# Patient Record
Sex: Female | Born: 1955 | Race: White | Hispanic: No | Marital: Married | Smoking: Former smoker
Health system: Southern US, Community
[De-identification: ages and names within clinical notes are randomized; demographics above are authoritative.]

## PROBLEM LIST (undated history)

## (undated) DIAGNOSIS — F32A Depression, unspecified: Secondary | ICD-10-CM

## (undated) DIAGNOSIS — E785 Hyperlipidemia, unspecified: Secondary | ICD-10-CM

## (undated) DIAGNOSIS — K219 Gastro-esophageal reflux disease without esophagitis: Secondary | ICD-10-CM

## (undated) DIAGNOSIS — I700 Atherosclerosis of aorta: Secondary | ICD-10-CM

## (undated) DIAGNOSIS — G43909 Migraine, unspecified, not intractable, without status migrainosus: Secondary | ICD-10-CM

## (undated) DIAGNOSIS — J302 Other seasonal allergic rhinitis: Secondary | ICD-10-CM

## (undated) DIAGNOSIS — G4700 Insomnia, unspecified: Secondary | ICD-10-CM

## (undated) DIAGNOSIS — F329 Major depressive disorder, single episode, unspecified: Secondary | ICD-10-CM

## (undated) DIAGNOSIS — S6390XA Sprain of unspecified part of unspecified wrist and hand, initial encounter: Secondary | ICD-10-CM

## (undated) DIAGNOSIS — M1990 Unspecified osteoarthritis, unspecified site: Secondary | ICD-10-CM

## (undated) HISTORY — DX: Gastro-esophageal reflux disease without esophagitis: K21.9

## (undated) HISTORY — PX: TONSILLECTOMY: SUR1361

## (undated) HISTORY — PX: BLADDER SUSPENSION: SHX72

## (undated) HISTORY — DX: Migraine, unspecified, not intractable, without status migrainosus: G43.909

## (undated) HISTORY — DX: Atherosclerosis of aorta: I70.0

## (undated) HISTORY — DX: Insomnia, unspecified: G47.00

## (undated) HISTORY — DX: Depression, unspecified: F32.A

## (undated) HISTORY — DX: Hyperlipidemia, unspecified: E78.5

## (undated) HISTORY — DX: Major depressive disorder, single episode, unspecified: F32.9

## (undated) HISTORY — DX: Sprain of unspecified part of unspecified wrist and hand, initial encounter: S63.90XA

---

## 1976-11-21 HISTORY — PX: OTHER SURGICAL HISTORY: SHX169

## 1981-11-21 HISTORY — PX: DILATION AND CURETTAGE OF UTERUS: SHX78

## 1982-11-21 HISTORY — PX: ABDOMINAL HYSTERECTOMY: SHX81

## 1992-11-21 HISTORY — PX: OTHER SURGICAL HISTORY: SHX169

## 1999-12-01 LAB — HM COLONOSCOPY: HM Colonoscopy: NORMAL

## 2000-05-29 ENCOUNTER — Other Ambulatory Visit: Admission: RE | Admit: 2000-05-29 | Discharge: 2000-05-29 | Payer: Self-pay | Admitting: Gynecology

## 2000-11-21 HISTORY — PX: APPENDECTOMY: SHX54

## 2001-12-17 ENCOUNTER — Ambulatory Visit (HOSPITAL_COMMUNITY): Admission: RE | Admit: 2001-12-17 | Discharge: 2001-12-17 | Payer: Self-pay | Admitting: Internal Medicine

## 2001-12-18 ENCOUNTER — Encounter: Payer: Self-pay | Admitting: Internal Medicine

## 2002-07-19 ENCOUNTER — Other Ambulatory Visit: Admission: RE | Admit: 2002-07-19 | Discharge: 2002-07-19 | Payer: Self-pay | Admitting: Obstetrics and Gynecology

## 2002-10-16 ENCOUNTER — Encounter: Payer: Self-pay | Admitting: Internal Medicine

## 2002-10-16 ENCOUNTER — Ambulatory Visit (HOSPITAL_COMMUNITY): Admission: RE | Admit: 2002-10-16 | Discharge: 2002-10-16 | Payer: Self-pay | Admitting: *Deleted

## 2003-07-07 ENCOUNTER — Other Ambulatory Visit: Admission: RE | Admit: 2003-07-07 | Discharge: 2003-07-07 | Payer: Self-pay | Admitting: Obstetrics and Gynecology

## 2003-11-22 HISTORY — PX: BLADDER SUSPENSION: SHX72

## 2003-12-08 ENCOUNTER — Ambulatory Visit (HOSPITAL_COMMUNITY): Admission: RE | Admit: 2003-12-08 | Discharge: 2003-12-08 | Payer: Self-pay | Admitting: Internal Medicine

## 2004-10-07 ENCOUNTER — Inpatient Hospital Stay (HOSPITAL_COMMUNITY): Admission: RE | Admit: 2004-10-07 | Discharge: 2004-10-09 | Payer: Self-pay | Admitting: Obstetrics and Gynecology

## 2004-11-21 ENCOUNTER — Emergency Department (HOSPITAL_COMMUNITY): Admission: EM | Admit: 2004-11-21 | Discharge: 2004-11-22 | Payer: Self-pay | Admitting: Emergency Medicine

## 2008-11-30 LAB — HM MAMMOGRAPHY: HM Mammogram: NORMAL

## 2009-03-21 LAB — CONVERTED CEMR LAB: Pap Smear: ABNORMAL

## 2009-08-25 ENCOUNTER — Ambulatory Visit: Payer: Self-pay | Admitting: Family Medicine

## 2009-08-25 DIAGNOSIS — J209 Acute bronchitis, unspecified: Secondary | ICD-10-CM

## 2009-08-25 DIAGNOSIS — G4700 Insomnia, unspecified: Secondary | ICD-10-CM

## 2009-08-25 DIAGNOSIS — G43909 Migraine, unspecified, not intractable, without status migrainosus: Secondary | ICD-10-CM

## 2009-08-25 HISTORY — DX: Migraine, unspecified, not intractable, without status migrainosus: G43.909

## 2009-08-25 HISTORY — DX: Insomnia, unspecified: G47.00

## 2009-10-12 ENCOUNTER — Ambulatory Visit: Payer: Self-pay | Admitting: Family Medicine

## 2009-10-22 ENCOUNTER — Ambulatory Visit: Payer: Self-pay | Admitting: Internal Medicine

## 2009-10-22 DIAGNOSIS — J069 Acute upper respiratory infection, unspecified: Secondary | ICD-10-CM | POA: Insufficient documentation

## 2009-11-17 ENCOUNTER — Telehealth: Payer: Self-pay | Admitting: Family Medicine

## 2010-03-31 ENCOUNTER — Ambulatory Visit: Payer: Self-pay | Admitting: Family Medicine

## 2010-03-31 DIAGNOSIS — S6390XA Sprain of unspecified part of unspecified wrist and hand, initial encounter: Secondary | ICD-10-CM

## 2010-03-31 HISTORY — DX: Sprain of unspecified part of unspecified wrist and hand, initial encounter: S63.90XA

## 2010-04-02 ENCOUNTER — Telehealth: Payer: Self-pay | Admitting: Family Medicine

## 2010-12-21 NOTE — Progress Notes (Signed)
Summary: Andrea Gregory please call re: Indomethacin  Phone Note Call from Patient Call back at Work Phone [phone]   Caller: Patient Call For: [name] MD Summary of Call: pt is returning Andrea Gregory call  Per pt, Dr Rayburn Ma prescribed Indomethacin 50mg , one tab two times a day as needed for migraines #20, 0 RF.  Last refill was 07-15-2008, she takes them very infrequently Initial call taken by: Heron Sabins,  Apr 02, 2010 2:38 PM  Follow-up for Phone Call        OK to refill Follow-up by: [name] MD,  Apr 02, 2010 5:06 PM  Additional Follow-up for Phone Call Additional follow up Details #1::        Rx sent to pharmacy from original escribe, pt informed Additional Follow-up by: Sid Falcon LPN,  Apr 02, 2010 5:39 PM    New/Updated Medications: INDOMETHACIN 50 MG CAPS (INDOMETHACIN) take one capsule twice a day as needed for migraines

## 2010-12-21 NOTE — Assessment & Plan Note (Signed)
Summary: finger injury/dm   Vital Signs:  Patient profile:   55 year old female Menstrual status:  hysterectomy Temp:     98.2 degrees F oral BP sitting:   110 / 78  (left arm) Cuff size:   regular  Vitals Entered By: Sid Falcon LPN (Mar 31, 2010 3:27 PM) CC: left little finger injury last night   History of Present Illness: Patient seen with left fifth phalanx injury. This occurred last night. Patient was on the telephone and in the process of taking her shirt off, somehow her finger got caught up in her shirt with twisting type injury. Pain mostly PIP joint with some mild bruising and swelling. Good range of motion. No other injury. Pain with gripping.  Allergies (verified): No Known Drug Allergies  Past History:  Past Medical History: Anemia Hay fever, Allergies  Migraines Blood transfusion tobacco abuse PMH reviewed for relevance  Review of Systems      See HPI  Physical Exam  General:  Well-developed,well-nourished,in no acute distress; alert,appropriate and cooperative throughout examination Extremities:  left fifth finger reveals some mild ecchymosis volar surface PIP joint with some mild swelling around this region. There is tenderness minimally around the PIP joint otherwise no bony tenderness. She has full range of motion with extension and flexion at the PIP and DIP joint left fifth digit. No wrist tenderness.   Impression & Recommendations:  Problem # 1:  SPRAIN AND STRAIN OF UNSPECIFIED SITE OF HAND (ICD-842.10) suspect strain left fifth PIP joint. Splint for 2 weeks. Offered x-ray but patient wishes to treat conservatively at this time. Start range of motion after one to 2 weeks of splinting.  Complete Medication List: 1)  Lexapro 10 Mg Tabs (Escitalopram oxalate) .... Once daily at bedtime 2)  Lorazepam 1 Mg Tabs (Lorazepam) .... Once daily at bedtime 3)  Azithromycin 250 Mg Tabs (Azithromycin) .... 2 by mouth today then one lpo once daily for 4  days. 4)  Hydrocodone-homatropine 5-1.5 Mg/34ml Syrp (Hydrocodone-homatropine) .Marland Kitchen.. 1 teaspoon every 6 hours as needed for cough 5)  Chantix Starting Month Pak 0.5 Mg X 11 & 1 Mg X 42 Tabs (Varenicline tartrate) .... As directed 6)  Chantix Continuing Month Pak 1 Mg Tabs (Varenicline tartrate) .... As directed  Patient Instructions: 1)  Leave on finger splint for one to 2 weeks and then take off and work on range of motion.

## 2011-04-08 NOTE — H&P (Signed)
NAMEKATLEN, SEYER NO.:  [id number]   MEDICAL RECORD NO.:  [id number]          PATIENT TYPE:  INP   LOCATION:  NA                            FACILITY:  [facility]   PHYSICIAN:  Carrington Clamp, M.D. DATE OF BIRTH:  10/23/1956   DATE OF ADMISSION:  DATE OF DISCHARGE:                                HISTORY & PHYSICAL   CHIEF COMPLAINT:  This is a 55 year old G2 P2-0-0-2 complaining of stress  urinary incontinence and rectal splinting.   HISTORY OF PRESENT ILLNESS:  Ms. Christoper Allegra came to see me back in August 2005  complaining of leaking urine when she coughed or sneezed.  She occasionally  wears a pad, has gotten worse with walking, and the amount has been  worsening as well where she is now losing larger and larger amounts.  Occasionally, she will lose urine at least two times a day.  The patient  also complains of a rectocele and having to force stool out and strain in  order to evacuate her bowels.  The patient underwent a cystometrics  evaluation which revealed a leak point pressure of 86 and her using her  abdominal muscles to void.  It also indicated a slight decrease in  compliance and some dribbling accidents.  The patient has stated that her  accidents were mostly with laughing, running, jumping on trampoline,  occasionally soaking her pants.  The results of the urodynamics were  discussed with the patient, and the patient understands that there is a  small chance of need to have a catheter in after the procedure in order to  retrain her bladder and her voiding techniques in order to be able to empty  her bladder adequately.  The patient desires definitive therapy.   PAST MEDICAL HISTORY:  The patient does not have an history of heart  disease, diabetes, high blood pressure, or any other medical abnormalities.   PAST SURGICAL HISTORY:  The patient had a total vaginal hysterectomy without  BSO and an appendectomy.   PAST OBSTETRICAL HISTORY:  Term spontaneous  vaginal delivery x2.   PAST GYNECOLOGICAL HISTORY:  No abnormal Pap smears or history of sexually-  transmitted diseases.   The patient is currently having symptoms of a nasal cold but no cough or  fever.   MEDICATIONS:  1.  Premarin 0.3 mg one p.o. daily.  2.  Nexium 40 mg one p.o. daily.  3.  Wellbutrin XL 150 mg daily.  4.  Occasional indomethacin 50 mg p.o. for migraines.  5.  Lorazepam 1 mg one p.o. q.h.s. for sleep.   ALLERGIES:  None.   TOBACCO:  The patient quit.   PHYSICAL EXAMINATION:  GENERAL:  The patient is well appearing.  HEENT:  Anicteric without lymphadenopathy.  There is no thyromegaly.  HEART:  Regular rate and rhythm.  LUNGS:  Clear to auscultation bilaterally.  ABDOMEN:  Soft, nontender, nondistended.  EXTREMITIES:  Benign.  SKIN:  Benign.  GENITOURINARY:  External genitalia was normal.  Vaginal vault had what  appeared to be positive urethral hypermobility and a cystocele to -2.  There  was also a rectocele to -2, but a well-suspended cuff and no masses.  Adnexa  and anus were otherwise normal.  Cystometrics as stated above, leak point  pressure of 86, voided was 47mL, PVR was 18 mL.  Vaginal packing was used to  help aid in the cystometrics.  There was slightly low compliance and obvious  abdominal wall use to void.   ASSESSMENT:  This is a 55 year old with worsening stress urinary  incontinence with increasing number of activities who desires definitive  therapy for her stress urinary incontinence.  She also has a chronic problem  with constipation and inability to evacuate her rectum.  She will undergo an  anterior and posterior repair and a tension-free vaginal tape procedure by  Gynecare with abdominal needles.  All risks, benefits, and alternatives have  been discussed with the patient.  The patient understands the possibility of  having to wear a catheter afterwards and having to retrain herself how to  void after the surgery.  The patient is  willing and able to be able to do  this.  The patient will receive SEDs for the procedure and receive  preoperative antibiotics.  She will also undergo a course of postoperative  antibiotics and a cystoscopy at the time of surgery in order to ensure  correct placement of the TVT.      MH/MEDQ  D:  10/07/2004  T:  10/07/2004  Job:  [id number]

## 2011-04-08 NOTE — Discharge Summary (Signed)
[name], Andrea Gregory NO.:  [id number]   MEDICAL RECORD NO.:  [id number]          PATIENT TYPE:  INP   LOCATION:  9304                          FACILITY:  [facility]   PHYSICIAN:  Carrington Clamp, M.D. DATE OF BIRTH:  02-Dec-1955   DATE OF ADMISSION:  10/07/2004  DATE OF DISCHARGE:  10/09/2004                                 DISCHARGE SUMMARY   ADMITTING DIAGNOSIS:  Stress urinary incontinence, cystocele, and rectocele.   DISCHARGE DIAGNOSIS:  Stress urinary incontinence, cystocele, and rectocele.   PERTINENT PROCEDURES PERFORMED:  Tension-free vaginal tape and anterior and  posterior repair and cystoscopy.   PERTINENT TESTS PERFORMED:  Preoperative H&H of 13.6 and postoperative H&H  of 10.1 and 29.   HISTORY AND PHYSICAL:  Please refer to dictated History and Physical on  chart but briefly, this is a 55 year old G2 P2-0-0-2 complaining of stress  urinary incontinence, cystocele, and rectal splinting.   HOSPITAL COURSE:  The patient was admitted on October 07, 2004 for the  above-named procedures and underwent them without complications.  On  postoperative day #1, the patient was very nauseated and unable to sit up  and walk around secondary to dry heaves but she was able to keep some sips  down.  The vaginal pack was removed and the patient was moved from the  morphine pump to p.o. medications.  However, by the evening the patient was  still having some difficulties and so the patient was kept until  postoperative day #2.  On postoperative day #2, the narcotic hangover was  resolved and the patient was eating and ambulating and feeling much better.  The patient was discharged with the Foley in place with the following.   MEDICATIONS:  1.  Percocet 5 mg p.o. q.4-6h. p.r.n. pain.  2.  Colace 100 mg b.i.d. x6 weeks.  3.  Iron over-the-counter.  4.  Milk of Magnesia as needed.  5.  Ceftin 250 mg one p.o. b.i.d.   PAIN MANAGEMENT:  As above.   ACTIVITY:  Pelvic  rest x6 weeks, no lifting or straining x6 weeks.   DIET:  High fiber, high water.   WOUND CARE:  Shower only.   The patient was discharged home with the Foley catheter in place and shown  how to change the leg bag for the larger bag a night.  Follow-up date was  the following Tuesday for a voiding trial.      MH/MEDQ  D:  10/28/2004  T:  10/28/2004  Job:  [id number]

## 2011-04-08 NOTE — Op Note (Signed)
NAMEVINAYA, SANCHO NO.:  [id number]   MEDICAL RECORD NO.:  [id number]          PATIENT TYPE:  INP   LOCATION:  9399                          FACILITY:  [facility]   PHYSICIAN:  Carrington Clamp, M.D. DATE OF BIRTH:  [birth year]   DATE OF PROCEDURE:  10/07/2004  DATE OF DISCHARGE:                                 OPERATIVE REPORT   PREOPERATIVE DIAGNOSES:  1.  Stress urinary incontinence.  2.  Cystocele, rectocele.   POSTOPERATIVE DIAGNOSES:  1.  Stress urinary incontinence.  2.  Cystocele, rectocele.   PROCEDURE:  Tension-free vaginal tape Gynecare with abdominal needles,  anterior and posterior repair, and cystoscopy.   SURGEON:  Carrington Clamp, M.D.   ASSISTANT:  Randye Lobo, M.D.   ANESTHESIA:  LMA.   ESTIMATED BLOOD LOSS:  500 cc.   URINE OUTPUT:  700 cc.   IV FLUIDS:  3000 cc.   COMPLICATIONS:  None.   FINDINGS:  Cystocele, rectocele to a -2 above the hymenal ring each.  Cystoscopy revealed a normal dome and anatomy of the bladder and normal-  appearing urethral sphincter.  The needles, after they had been passed were  in place and were not identified inside the bladder and assuredly had not  punctured the bladder.  The ureteral orifices were inspected for several  minutes bilaterally, both before and after the tape had been passed.  Although there was obvious indigo carmine in the bladder and the ureteral  orifices could be identified by sight, no clear huge jet of indigo carmine  was observed from either of the ureteral orifices.  However, each of the  ureteral orifices had a small amount of indigo carmine puff and also  adequate peristalsis.  If there are any problems postoperatively, we will do  an IVP, but the patient's anatomy and physiology of the bladder appear to be  completely normal.   MEDICATIONS:  1% Xylocaine with epinephrine and Premarin cream.   PATHOLOGY:  None.   COUNTS:  Counts were correct x3.   DESCRIPTION OF  PROCEDURE:  After adequate LMA anesthesia was achieved, the  patient was prepped and draped in the usual sterile fashion in the dorsal  lithotomy position.   The anterior leaf close to the top of the cuff was grasped with a pair of  Allis', and an incision was made with the scalpel in between.  The vaginal  mucosa was then elevated off of the vesicouterine fascia with sharp and  blunt dissection with the Metzenbaum scissors in the midline.  The mucosa  was incised in the midline, and the mucosa was removed lateral further with  sharp and blunt dissection with the Metzenbaum scissors.  This was done  until the pelvic diaphragm could be palpated underneath the pubic symphysis.  At this point, stab incisions were made immediately suprapubically with the  scalpel 2 cm bilaterally from lateral to midline.  The abdominal needles  were placed into the abdomen bilaterally.  The needle was pushed through the  rectus fascia and then rocked forward to hug the pubic symphysis  until the  needles were passed through the pelvic diaphragm and through the opening in  the vaginal mucosa.  Cystoscopy was performed at this point, and the needles  were definitely not in the bladder and the bladder was intact.  The ureteral  orifices could be identified, but no clear jet was seen at this point.  The  tape was then passed after the vaginal needles were connected to the  abdominal needles and pushed up through the abdomen.  The tape sheath was  then removed with a Kelly underneath the urethra in order to keep the  appropriate tension.  The bladder was then again looked at, and again, the  ureteral orifices could be identified.  Indigo carmine was clearly in the  bladder, but significant jets could not be seen bilaterally.  There was good  peristalsis of each of the ureteral orifices, and there was obvious indigo  carmine in the bladder.   After the tape had been cut and reduced to below the skin, the skin was   closed with Dermabond and the vaginal mucosa trimmed with Metzenbaum  scissors.  The cystocele was closed with three mattress stitches of 0  Vicryl.  An additional stitch of 2-0 Vicryl was used on the left-hand side  to insure hemostasis.  The vaginal mucosa was then closed with interrupted  figure-of-eight stitches with 2-0 Vicryl.   Attention was then turned to the posterior perineum which was grasped with a  pair of Allis'.  A inverted triangular incision was made with the scalpel,  and this tissue was removed.  The vaginal mucosa was then removed from the  rectovaginal fascia with sharp and blunt dissection with the Metzenbaum  scissors and incised the vaginal mucosa in the midline.  Dissection was  carried out laterally with sharp and blunt dissection with the Metzenbaum  scissors, and then four mattress stitches of 0 Vicryl were placed in the  rectovaginal fascia in order to close the defect.  The rectovaginal mucosa  was then trimmed and closed with a running locked stitch of 2-0 Vicryl.  The  vagina was packed with 2-inch NuGauze with estrogen.  Before the vagina was  packed, it was checked and found to relatively easily admit two fingers side  by side.   The patient tolerated the procedure well.  She was returned to the recovery  room in stable condition.      MH/MEDQ  D:  10/07/2004  T:  10/08/2004  Job:  [id number]

## 2011-04-08 NOTE — Procedures (Signed)
[facility]  Patient:    [name], [name] Visit Number: [id number] MRN: [id number]          Service Type: Attending:  Carylon Perches, M.D. Dictated by:   Carylon Perches, M.D. Proc. Date: 12/17/01                                Stress Test  The patient exercised 8 minutes 5 seconds (2 minutes 5 seconds into stage 3 of the Bruce protocol), attaining a maximum heart rate of 159 (91% of the age predicted maximal rate) at a workload of 10.1 METs and discontinued exercise due to fatigue.  There were no symptoms of chest pain.  There were no arrhythmias.  There were no EKG changes diagnostic of ischemia.  The baseline EKG revealed normal sinus rhythm at 62 beats per minute.  IMPRESSION:  No evidence of exercise-induced ischemia.  Cardiolite images pending. Dictated by:   Carylon Perches, M.D. Attending:  Carylon Perches, M.D. DD:  12/17/01 TD:  12/17/01 Job: [id number]

## 2011-10-31 ENCOUNTER — Encounter: Payer: Self-pay | Admitting: Family Medicine

## 2011-10-31 ENCOUNTER — Ambulatory Visit (INDEPENDENT_AMBULATORY_CARE_PROVIDER_SITE_OTHER): Payer: BC Managed Care – PPO | Admitting: Family Medicine

## 2011-10-31 DIAGNOSIS — G4700 Insomnia, unspecified: Secondary | ICD-10-CM

## 2011-10-31 DIAGNOSIS — E785 Hyperlipidemia, unspecified: Secondary | ICD-10-CM | POA: Insufficient documentation

## 2011-10-31 MED ORDER — LORAZEPAM 1 MG PO TABS: 1.0000 mg | ORAL_TABLET | Freq: Two times a day (BID) | ORAL | Status: DC | PRN

## 2011-10-31 NOTE — Progress Notes (Signed)
  Subjective:    Patient ID: Andrea Gregory, female    DOB: October 09, 1956, 55 y.o.   MRN: [id number]  HPI  Here for the following issues. Recent labs per gynecologist. Elevated cholesterol of 249 with triglycerides 207, HDL 49, and LDL 158. Patient has never taken medications for lipids. Risk factors include age and smoking history. Smokes less than one half packs years per day. Trying to quit. No consistent exercise. No history of hypertension. No diabetes. No family history of premature CAD.  Patient has long history of chronic depression and anxiety and chronic insomnia. Has been on lorazepam 1 mg twice a day for many years and requesting refills. Mostly takes this at night. Also Lexapro 20 mg daily and depression controlled.   Review of Systems  Constitutional: Negative for appetite change and unexpected weight change.  Respiratory: Negative for cough and shortness of breath.   Cardiovascular: Negative for chest pain and palpitations.  Neurological: Negative for dizziness and headaches.  Psychiatric/Behavioral: Negative for confusion and agitation.       Objective:   Physical Exam  Constitutional: She appears well-developed and well-nourished.  Cardiovascular: Normal rate and regular rhythm.   Pulmonary/Chest: Effort normal and breath sounds normal. No respiratory distress. She has no wheezes. She has no rales.  Musculoskeletal: She exhibits no edema.  Psychiatric: She has a normal mood and affect. Her behavior is normal.          Assessment & Plan:  #1 hyperlipidemia. Long discussion with patient regarding options. She is reluctant to consider statin medication at this time. Work on weight loss. Quit smoking. Reduce saturated fat. Increase soluble fiber. Education information given. Reassess fasting lipids in 6 months #2 history of chronic insomnia and anxiety. Refilled lorazepam for 6 months

## 2011-10-31 NOTE — Patient Instructions (Signed)
Cholesterol Control Diet Cholesterol levels in your body are determined significantly by your diet. Cholesterol levels may also be related to heart disease. The following material helps to explain this relationship and discusses what you can do to help keep your heart healthy. Not all cholesterol is bad. Low-density lipoprotein (LDL) cholesterol is the "bad" cholesterol. It may cause fatty deposits to build up inside your arteries. High-density lipoprotein (HDL) cholesterol is "good." It helps to remove the "bad" LDL cholesterol from your blood. Cholesterol is a very important risk factor for heart disease. Other risk factors are high blood pressure, smoking, stress, heredity, and weight. The heart muscle gets its supply of blood through the coronary arteries. If your LDL cholesterol is high and your HDL cholesterol is low, you are at risk for having fatty deposits build up in your coronary arteries. This leaves less room through which blood can flow. Without sufficient blood and oxygen, the heart muscle cannot function properly and you may feel chest pains (angina pectoris). When a coronary artery closes up entirely, a part of the heart muscle may die, causing a heart attack (myocardial infarction). CHECKING CHOLESTEROL When your caregiver sends your blood to a lab to be analyzed for cholesterol, a complete lipid (fat) profile may be done. With this test, the total amount of cholesterol and levels of LDL and HDL are determined. Triglycerides are a type of fat that circulates in the blood and can also be used to determine heart disease risk. The list below describes what the numbers should be: Test: Total Cholesterol.  Less than 200 mg/dl.  Test: LDL "bad cholesterol."  Less than 100 mg/dl.   Less than 70 mg/dl if you are at very high risk of a heart attack or sudden cardiac death.  Test: HDL "good cholesterol."  Greater than 50 mg/dl for women.   Greater than 40 mg/dl for men.  Test:  Triglycerides.  Less than 150 mg/dl.  CONTROLLING CHOLESTEROL WITH DIET Although exercise and lifestyle factors are important, your diet is key. That is because certain foods are known to raise cholesterol and others to lower it. The goal is to balance foods for their effect on cholesterol and more importantly, to replace saturated and trans fat with other types of fat, such as monounsaturated fat, polyunsaturated fat, and omega-3 fatty acids. On average, a person should consume no more than 15 to 17 g of saturated fat daily. Saturated and trans fats are considered "bad" fats, and they will raise LDL cholesterol. Saturated fats are primarily found in animal products such as meats, butter, and cream. However, that does not mean you need to sacrifice all your favorite foods. Today, there are good tasting, low-fat, low-cholesterol substitutes for most of the things you like to eat. Choose low-fat or nonfat alternatives. Choose round or loin cuts of red meat, since these types of cuts are lowest in fat and cholesterol. Chicken (without the skin), fish, veal, and ground turkey breast are excellent choices. Eliminate fatty meats, such as hot dogs and salami. Even shellfish have little or no saturated fat. Have a 3 oz (85 g) portion when you eat lean meat, poultry, or fish. Trans fats are also called "partially hydrogenated oils." They are oils that have been scientifically manipulated so that they are solid at room temperature resulting in a longer shelf life and improved taste and texture of foods in which they are added. Trans fats are found in stick margarine, some tub margarines, cookies, crackers, and baked goods.  When   baking and cooking, oils are an excellent substitute for butter. The monounsaturated oils are especially beneficial since it is believed they lower LDL and raise HDL. The oils you should avoid entirely are saturated tropical oils, such as coconut and palm.  Remember to eat liberally from food  groups that are naturally free of saturated and trans fat, including fish, fruit, vegetables, beans, grains (barley, rice, couscous, bulgur wheat), and pasta (without cream sauces).  IDENTIFYING FOODS THAT LOWER CHOLESTEROL  Soluble fiber may lower your cholesterol. This type of fiber is found in fruits such as apples, vegetables such as broccoli, potatoes, and carrots, legumes such as beans, peas, and lentils, and grains such as barley. Foods fortified with plant sterols (phytosterol) may also lower cholesterol. You should eat at least 2 g per day of these foods for a cholesterol lowering effect.  Read package labels to identify low-saturated fats, trans fats free, and low-fat foods at the supermarket. Select cheeses that have only 2 to 3 g saturated fat per ounce. Use a heart-healthy tub margarine that is free of trans fats or partially hydrogenated oil. When buying baked goods (cookies, crackers), avoid partially hydrogenated oils. Breads and muffins should be made from whole grains (whole-wheat or whole oat flour, instead of "flour" or "enriched flour"). Buy non-creamy canned soups with reduced salt and no added fats.  FOOD PREPARATION TECHNIQUES  Never deep-fry. If you must fry, either stir-fry, which uses very little fat, or use non-stick cooking sprays. When possible, broil, bake, or roast meats, and steam vegetables. Instead of dressing vegetables with butter or margarine, use lemon and herbs, applesauce and cinnamon (for squash and sweet potatoes), nonfat yogurt, salsa, and low-fat dressings for salads.  LOW-SATURATED FAT / LOW-FAT FOOD SUBSTITUTES Meats / Saturated Fat (g)  Avoid: Steak, marbled (3 oz/85 g) / 11 g   Choose: Steak, lean (3 oz/85 g) / 4 g   Avoid: Hamburger (3 oz/85 g) / 7 g   Choose: Hamburger, lean (3 oz/85 g) / 5 g   Avoid: Ham (3 oz/85 g) / 6 g   Choose: Ham, lean cut (3 oz/85 g) / 2.4 g   Avoid: Chicken, with skin, dark meat (3 oz/85 g) / 4 g   Choose: Chicken,  skin removed, dark meat (3 oz/85 g) / 2 g   Avoid: Chicken, with skin, light meat (3 oz/85 g) / 2.5 g   Choose: Chicken, skin removed, light meat (3 oz/85 g) / 1 g  Dairy / Saturated Fat (g)  Avoid: Whole milk (1 cup) / 5 g   Choose: Low-fat milk, 2% (1 cup) / 3 g   Choose: Low-fat milk, 1% (1 cup) / 1.5 g   Choose: Skim milk (1 cup) / 0.3 g   Avoid: Hard cheese (1 oz/28 g) / 6 g   Choose: Skim milk cheese (1 oz/28 g) / 2 to 3 g   Avoid: Cottage cheese, 4% fat (1 cup) / 6.5 g   Choose: Low-fat cottage cheese, 1% fat (1 cup) / 1.5 g   Avoid: Ice cream (1 cup) / 9 g   Choose: Sherbet (1 cup) / 2.5 g   Choose: Nonfat frozen yogurt (1 cup) / 0.3 g   Choose: Frozen fruit bar / trace   Avoid: Whipped cream (1 tbs) / 3.5 g   Choose: Nondairy whipped topping (1 tbs) / 1 g  Condiments / Saturated Fat (g)  Avoid: Mayonnaise (1 tbs) / 2 g   Choose: Low-fat mayonnaise (  1 tbs) / 1 g   Avoid: Butter (1 tbs) / 7 g   Choose: Extra light margarine (1 tbs) / 1 g   Avoid: Coconut oil (1 tbs) / 11.8 g   Choose: Olive oil (1 tbs) / 1.8 g   Choose: Corn oil (1 tbs) / 1.7 g   Choose: Safflower oil (1 tbs) / 1.2 g   Choose: Sunflower oil (1 tbs) / 1.4 g   Choose: Soybean oil (1 tbs) / 2.4 g   Choose: Canola oil (1 tbs) / 1 g  Document Released: 11/07/2005 Document Revised: 07/20/2011 Document Reviewed: 04/28/2011 [employer] Patient Information 2012 [employer]. 

## 2011-11-04 ENCOUNTER — Encounter: Payer: Self-pay | Admitting: Family Medicine

## 2011-11-04 ENCOUNTER — Ambulatory Visit (INDEPENDENT_AMBULATORY_CARE_PROVIDER_SITE_OTHER): Payer: BC Managed Care – PPO | Admitting: Family Medicine

## 2011-11-04 VITALS — BP 122/64 | HR 87 | Temp 98.1°F

## 2011-11-04 DIAGNOSIS — J40 Bronchitis, not specified as acute or chronic: Secondary | ICD-10-CM

## 2011-11-04 MED ORDER — ALBUTEROL SULFATE HFA 108 (90 BASE) MCG/ACT IN AERS: 2.0000 | INHALATION_SPRAY | Freq: Four times a day (QID) | RESPIRATORY_TRACT | Status: DC | PRN

## 2011-11-04 NOTE — Progress Notes (Signed)
  Subjective:    Patient ID: Andrea Gregory, female    DOB: Mar 30, 1956, 55 y.o.   MRN: [id number]  HPI 55 year old white female patient, half a pack per day smoker, and with complaints of fever chills, sneezing, cough, fatigue, that had been going on for about 2 days. She is better today. She has been taking over-the-counter cold and cough medications that have helped. She denies any sinus pressure or pain, no nausea, vomiting, diarrhea, joint pain, or headaches. Reports sick contacts; grandchildren.  Review of Systems As stated above    Objective:   Physical Exam Constitutional: Alert and oriented in no acute distress  ENT: ears are clear bilaterally, pharynx slightly reddened no exudate. No sinus tenderness to palpation. Neck: No lymphadenopathy Lungs: Coarse breath sounds noted but good air movement. No wheezing.  Cardiac: Regular rate and rhythm, no murmurs rubs or gallops Skin: Warm and dry, no cyanosis Psychiatric: Normal mood and affect        Assessment & Plan:  Assessment: Acute bronchitis  Plan: Proair air HFA 2 puffs every 4-6 hours when necessary. Over-the-counter symptomatic treatment for relief. Rest. Drink plenty of fluids. Call if symptoms worsen or persist. Recheck as scheduled and when necessary.

## 2011-11-04 NOTE — Patient Instructions (Signed)

## 2012-04-30 ENCOUNTER — Ambulatory Visit: Payer: BC Managed Care – PPO | Admitting: Family Medicine

## 2012-05-10 ENCOUNTER — Ambulatory Visit: Payer: BC Managed Care – PPO | Admitting: Family Medicine

## 2012-05-22 ENCOUNTER — Other Ambulatory Visit: Payer: Self-pay | Admitting: Family Medicine

## 2012-05-23 NOTE — Telephone Encounter (Signed)
Lorazepam last filled at [facility] 10-31-11, #60 with 5 refills.  Pt is scheduled for [facility] later this month

## 2012-05-24 NOTE — Telephone Encounter (Signed)
May refill for 3 months ?

## 2012-06-19 ENCOUNTER — Encounter: Payer: Self-pay | Admitting: Family Medicine

## 2012-06-19 ENCOUNTER — Ambulatory Visit (INDEPENDENT_AMBULATORY_CARE_PROVIDER_SITE_OTHER): Payer: BC Managed Care – PPO | Admitting: Family Medicine

## 2012-06-19 VITALS — BP 122/84 | Temp 98.3°F | Wt 160.0 lb

## 2012-06-19 DIAGNOSIS — F339 Major depressive disorder, recurrent, unspecified: Secondary | ICD-10-CM

## 2012-06-19 DIAGNOSIS — Z7189 Other specified counseling: Secondary | ICD-10-CM

## 2012-06-19 DIAGNOSIS — Z716 Tobacco abuse counseling: Secondary | ICD-10-CM

## 2012-06-19 DIAGNOSIS — E785 Hyperlipidemia, unspecified: Secondary | ICD-10-CM

## 2012-06-19 LAB — LDL CHOLESTEROL, DIRECT: Direct LDL: 197.9 mg/dL

## 2012-06-19 MED ORDER — ESCITALOPRAM OXALATE 20 MG PO TABS: 20.0000 mg | ORAL_TABLET | Freq: Every day | ORAL | Status: DC

## 2012-06-19 MED ORDER — BUPROPION HCL ER (XL) 300 MG PO TB24: 300.0000 mg | ORAL_TABLET | Freq: Every day | ORAL | Status: DC

## 2012-06-19 NOTE — Progress Notes (Signed)
  Subjective:    Patient ID: Andrea Gregory, female    DOB: 1956-08-30, 56 y.o.   MRN: [id number]  HPI  Patient seen for medical followup. She has history of recurrent depression, dyslipidemia, and chronic anxiety. She is maintained on Lexapro 20 mg daily and takes Ativan as needed. She smokes about one half to one pack cigarettes per day wants to quit. Previously used Chantix but had nightmares and irritability. She has not tried Wellbutrin but like to consider. She feels her depression is stable.  Hyperlipidemia with recent labs. She's made some dietary changes since then. No history of CAD. No family history of premature CAD. She's been reluctant to consider statin therapy. Previous LDL 158. No recent chest pains.  Past Medical History  Diagnosis Date  . INSOMNIA, CHRONIC 08/25/2009  . MIGRAINE HEADACHE 08/25/2009  . URI 10/22/2009  . Acute bronchitis 08/25/2009  . SPRAIN AND STRAIN OF UNSPECIFIED SITE OF HAND 03/31/2010   Past Surgical History  Procedure Date  . Appendectomy 2002  . Abdominal hysterectomy 1984  . Tonsillectomy   . Dilation and curettage of uterus 1983  . Miscarriage 1978    reports that she has been smoking Cigarettes.  She has a 17.5 pack-year smoking history. She does not have any smokeless tobacco history on file. Her alcohol and drug histories not on file. family history includes Alcohol abuse in her other; Cancer in her other; Diabetes in her other; Hypertension in her other; Mental illness in her other; Miscarriages / Stillbirths in her other; and Stroke in her other. No Known Allergies    Review of Systems  Constitutional: Negative for fatigue.  Eyes: Negative for visual disturbance.  Respiratory: Negative for cough, chest tightness, shortness of breath and wheezing.   Cardiovascular: Negative for chest pain, palpitations and leg swelling.  Neurological: Negative for dizziness, seizures, syncope, weakness, light-headedness and headaches.       Objective:   Physical Exam  Constitutional: She appears well-developed and well-nourished. No distress.  Neck: Neck supple. No thyromegaly present.  Cardiovascular: Normal rate and regular rhythm.   Pulmonary/Chest: Effort normal and breath sounds normal. No respiratory distress. She has no wheezes. She has no rales.  Musculoskeletal: She exhibits no edema.  Lymphadenopathy:    She has no cervical adenopathy.          Assessment & Plan:  #1 dyslipidemia. Recheck fasting lipid panel today. Diet and exercise discussed.  #2 smoking cessation. Patient requesting trial of Wellbutrin XL. 300 mg daily. Reviewed possible side effects. She knows to take this for least 2 weeks before stopping smoking.  Plan to take for 3 months. #3 history of recurrent depression/anxiety. Refill Lexapro for one year

## 2012-06-21 ENCOUNTER — Other Ambulatory Visit: Payer: Self-pay | Admitting: *Deleted

## 2012-06-21 DIAGNOSIS — E785 Hyperlipidemia, unspecified: Secondary | ICD-10-CM

## 2012-06-21 MED ORDER — ATORVASTATIN CALCIUM 20 MG PO TABS: 20.0000 mg | ORAL_TABLET | Freq: Every day | ORAL | Status: DC

## 2012-06-21 NOTE — Progress Notes (Signed)
Quick Note:  Pt informed, future orders sent, Rx sent ______

## 2012-08-02 ENCOUNTER — Other Ambulatory Visit (INDEPENDENT_AMBULATORY_CARE_PROVIDER_SITE_OTHER): Payer: BC Managed Care – PPO

## 2012-08-02 DIAGNOSIS — E785 Hyperlipidemia, unspecified: Secondary | ICD-10-CM

## 2012-08-02 LAB — HEPATIC FUNCTION PANEL
ALT: 15 U/L (ref 0–35)
Total Bilirubin: 0.4 mg/dL (ref 0.3–1.2)

## 2012-08-02 LAB — LIPID PANEL
HDL: 49.2 mg/dL (ref >39.00)
VLDL: 25.8 mg/dL (ref 0.0–40.0)

## 2012-08-03 NOTE — Progress Notes (Signed)
Quick Note:  Pt informed ______ 

## 2012-09-18 ENCOUNTER — Other Ambulatory Visit: Payer: Self-pay | Admitting: Family Medicine

## 2012-09-21 ENCOUNTER — Other Ambulatory Visit: Payer: Self-pay | Admitting: Family Medicine

## 2012-11-21 LAB — HM COLONOSCOPY: HM COLON: NORMAL

## 2012-12-15 ENCOUNTER — Other Ambulatory Visit: Payer: Self-pay | Admitting: Family Medicine

## 2012-12-17 ENCOUNTER — Other Ambulatory Visit: Payer: Self-pay | Admitting: Family Medicine

## 2012-12-18 NOTE — Telephone Encounter (Signed)
Lorazepam refill request, last filled 05-22-12, #60 with 5 refills Pt has appt this coming Thursday

## 2012-12-18 NOTE — Telephone Encounter (Signed)
Refill once 

## 2012-12-20 ENCOUNTER — Ambulatory Visit: Payer: BC Managed Care – PPO | Admitting: Family Medicine

## 2013-01-08 ENCOUNTER — Encounter: Payer: Self-pay | Admitting: Family Medicine

## 2013-01-08 ENCOUNTER — Ambulatory Visit (INDEPENDENT_AMBULATORY_CARE_PROVIDER_SITE_OTHER): Payer: BC Managed Care – PPO | Admitting: Family Medicine

## 2013-01-08 VITALS — BP 130/84 | Temp 98.7°F | Wt 164.0 lb

## 2013-01-08 DIAGNOSIS — Z1211 Encounter for screening for malignant neoplasm of colon: Secondary | ICD-10-CM

## 2013-01-08 DIAGNOSIS — G4700 Insomnia, unspecified: Secondary | ICD-10-CM

## 2013-01-08 DIAGNOSIS — E785 Hyperlipidemia, unspecified: Secondary | ICD-10-CM

## 2013-01-08 DIAGNOSIS — F339 Major depressive disorder, recurrent, unspecified: Secondary | ICD-10-CM | POA: Insufficient documentation

## 2013-01-08 NOTE — Progress Notes (Signed)
  Subjective:    Patient ID: Andrea Gregory, female    DOB: 1956-05-29, 57 y.o.   MRN: [id number]  HPI  Patient is a for medical followup. She has hyperlipidemia treated with Lipitor. Lipids were greatly improved at last visit 5 months ago. She has stopped smoking in the past few weeks. Overall feels well. She has some chronic insomnia and is maintained lorazepam for nighttime use only. She has history of recurrent depression currently stable on combination therapy with Lexapro and Wellbutrin. She denies any recent chest pains. No dizziness. Mood stable.  Last colonoscopy on record around 2001. Over 10 years since last colonoscopy. She does not recall any abnormalities with previous. No recent change of stools.  She continues to see gynecologist for regular checkups.  Past Medical History  Diagnosis Date  . INSOMNIA, CHRONIC 08/25/2009  . MIGRAINE HEADACHE 08/25/2009  . URI 10/22/2009  . Acute bronchitis 08/25/2009  . SPRAIN AND STRAIN OF UNSPECIFIED SITE OF HAND 03/31/2010   Past Surgical History  Procedure Laterality Date  . Appendectomy  2002  . Abdominal hysterectomy  1984  . Tonsillectomy    . Dilation and curettage of uterus  1983  . Miscarriage  1978    reports that she has been smoking Cigarettes.  She has a 17.5 pack-year smoking history. She does not have any smokeless tobacco history on file. Her alcohol and drug histories are not on file. family history includes Alcohol abuse in her other; Cancer in her other; Diabetes in her other; Hypertension in her other; Mental illness in her other; Miscarriages / Stillbirths in her other; and Stroke in her other. No Known Allergies    Review of Systems  Constitutional: Negative for appetite change, fatigue and unexpected weight change.  Eyes: Negative for visual disturbance.  Respiratory: Negative for cough, chest tightness, shortness of breath and wheezing.   Cardiovascular: Negative for chest pain, palpitations and leg swelling.   Genitourinary: Negative for dysuria.  Neurological: Negative for dizziness, seizures, syncope, weakness, light-headedness and headaches.       Objective:   Physical Exam  Constitutional: She appears well-developed and well-nourished. No distress.  Neck: Neck supple.  Cardiovascular: Normal rate and regular rhythm.   Pulmonary/Chest: Effort normal and breath sounds normal. No respiratory distress. She has no wheezes. She has no rales.  Musculoskeletal: She exhibits no edema.          Assessment & Plan:  #1 hyperlipidemia. Recheck lipids at followup in 6 months. Continue Lipitor #2 history of recurrent depression. She's had by her estimation over 3 episodes during her lifetime. We've recommended maintaining Wellbutrin and Lexapro  #3 history of chronic insomnia. Sleep hygiene discussed  #4 health maintenance. Last colonoscopy over 10 years ago. Patient agrees to setting up repeat screen-she requests in [place], [place].

## 2013-01-12 ENCOUNTER — Other Ambulatory Visit: Payer: Self-pay | Admitting: Family Medicine

## 2013-01-14 NOTE — Telephone Encounter (Signed)
Please clarify.  I thought she was taking lorazepam only at night.  Refill for 6 months.

## 2013-01-14 NOTE — Telephone Encounter (Signed)
Lorazepam BID last filled 12-15-12, #60 with 0 refills

## 2013-01-15 NOTE — Telephone Encounter (Signed)
I called pt and she said she sometimes takes 2 times a day but also said "what do I take that med for"?  Pt agreed to take at [facility] and see how that goes, #30 ok instead of #60.

## 2013-01-29 ENCOUNTER — Telehealth: Payer: Self-pay

## 2013-01-29 NOTE — Telephone Encounter (Signed)
LMOM to call.

## 2013-01-30 NOTE — Telephone Encounter (Signed)
LMOM to call.

## 2013-02-01 NOTE — Telephone Encounter (Signed)
Letter to pt

## 2013-02-06 NOTE — Telephone Encounter (Signed)
Letter to PCP

## 2013-02-12 ENCOUNTER — Telehealth: Payer: Self-pay

## 2013-02-12 NOTE — Telephone Encounter (Signed)
Pt called to schedule colonoscopy. OV ( due to meds ) with Tana Coast, PA, on 03/05/2013 at 8:00 AM.

## 2013-03-05 ENCOUNTER — Encounter: Payer: Self-pay | Admitting: Gastroenterology

## 2013-03-05 ENCOUNTER — Ambulatory Visit (INDEPENDENT_AMBULATORY_CARE_PROVIDER_SITE_OTHER): Payer: BC Managed Care – PPO | Admitting: Gastroenterology

## 2013-03-05 VITALS — BP 120/78 | HR 71 | Temp 97.8°F | Ht 65.0 in | Wt 163.4 lb

## 2013-03-05 DIAGNOSIS — R1314 Dysphagia, pharyngoesophageal phase: Secondary | ICD-10-CM

## 2013-03-05 DIAGNOSIS — R1310 Dysphagia, unspecified: Secondary | ICD-10-CM

## 2013-03-05 DIAGNOSIS — K219 Gastro-esophageal reflux disease without esophagitis: Secondary | ICD-10-CM | POA: Insufficient documentation

## 2013-03-05 DIAGNOSIS — Z800 Family history of malignant neoplasm of digestive organs: Secondary | ICD-10-CM

## 2013-03-05 MED ORDER — PEG 3350-KCL-NA BICARB-NACL 420 G PO SOLR: 4000.0000 mL | ORAL | Status: DC

## 2013-03-05 NOTE — Patient Instructions (Addendum)
We have scheduled you for an upper endoscopy and colonoscopy with Dr. Jena Gauss. Please see separate instructions.  Gastroesophageal Reflux Disease, Adult Gastroesophageal reflux disease (GERD) happens when acid from your stomach flows up into the esophagus. When acid comes in contact with the esophagus, the acid causes soreness (inflammation) in the esophagus. Over time, GERD may create small holes (ulcers) in the lining of the esophagus. CAUSES   Increased body weight. This puts pressure on the stomach, making acid rise from the stomach into the esophagus.  Smoking. This increases acid production in the stomach.  Drinking alcohol. This causes decreased pressure in the lower esophageal sphincter (valve or ring of muscle between the esophagus and stomach), allowing acid from the stomach into the esophagus.  Late evening meals and a full stomach. This increases pressure and acid production in the stomach.  A malformed lower esophageal sphincter. Sometimes, no cause is found. SYMPTOMS   Burning pain in the lower part of the mid-chest behind the breastbone and in the mid-stomach area. This may occur twice a week or more often.  Trouble swallowing.  Sore throat.  Dry cough.  Asthma-like symptoms including chest tightness, shortness of breath, or wheezing. DIAGNOSIS  Your caregiver may be able to diagnose GERD based on your symptoms. In some cases, X-rays and other tests may be done to check for complications or to check the condition of your stomach and esophagus. TREATMENT  Your caregiver may recommend over-the-counter or prescription medicines to help decrease acid production. Ask your caregiver before starting or adding any new medicines.  HOME CARE INSTRUCTIONS   Change the factors that you can control. Ask your caregiver for guidance concerning weight loss, quitting smoking, and alcohol consumption.  Avoid foods and drinks that make your symptoms worse, such as:  Caffeine or  alcoholic drinks.  Chocolate.  Peppermint or mint flavorings.  Garlic and onions.  Spicy foods.  Citrus fruits, such as oranges, lemons, or limes.  Tomato-based foods such as sauce, chili, salsa, and pizza.  Fried and fatty foods.  Avoid lying down for the 3 hours prior to your bedtime or prior to taking a nap.  Eat small, frequent meals instead of large meals.  Wear loose-fitting clothing. Do not wear anything tight around your waist that causes pressure on your stomach.  Raise the head of your bed 6 to 8 inches with wood blocks to help you sleep. Extra pillows will not help.  Only take over-the-counter or prescription medicines for pain, discomfort, or fever as directed by your caregiver.  Do not take aspirin, ibuprofen, or other nonsteroidal anti-inflammatory drugs (NSAIDs). SEEK IMMEDIATE MEDICAL CARE IF:   You have pain in your arms, neck, jaw, teeth, or back.  Your pain increases or changes in intensity or duration.  You develop nausea, vomiting, or sweating (diaphoresis).  You develop shortness of breath, or you faint.  Your vomit is green, yellow, black, or looks like coffee grounds or blood.  Your stool is red, bloody, or black. These symptoms could be signs of other problems, such as heart disease, gastric bleeding, or esophageal bleeding. MAKE SURE YOU:   Understand these instructions.  Will watch your condition.  Will get help right away if you are not doing well or get worse. Document Released: 08/17/2005 Document Revised: 01/30/2012 Document Reviewed: 05/27/2011 [facility] Patient Information 2013 [place], [place].

## 2013-03-05 NOTE — Progress Notes (Signed)
Cc PCP 

## 2013-03-05 NOTE — Assessment & Plan Note (Signed)
57 year old lady with prior remote history of heartburn treated with Nexium she now presents with acute onset heartburn, nausea. She has some day to solid food esophageal dysphagia. She also "coughs" up meat several minutes after a meal. She may have developed esophageal stricture and/or Zenker's diverticulum. Given new onset symptoms, recommend upper endoscopy with possible dilation in the near future with Dr. Jena Gauss. We will give Phenergan 25 mg IV 30 minutes prior to procedure to augment conscious sedation given her polypharmacy.  I have discussed the risks, alternatives, benefits with regards to but not limited to the risk of reaction to medication, bleeding, infection, perforation and the patient is agreeable to proceed. Written consent to be obtained.  She wants to hold off on taking daily PPI until after procedures.

## 2013-03-05 NOTE — Assessment & Plan Note (Signed)
Family history of colon cancer at advanced age in second degree relative. Several family members with breast and ovarian cancer. Recommend average risk screening colonoscopy at this time. Phenergan 25mg  IV 30 mins before procedure to augment conscious sedation given polypharmacy.  I have discussed the risks, alternatives, benefits with regards to but not limited to the risk of reaction to medication, bleeding, infection, perforation and the patient is agreeable to proceed. Written consent to be obtained.

## 2013-03-05 NOTE — Progress Notes (Signed)
Primary Care Physician:  Kristian Covey, MD  Primary Gastroenterologist:  Roetta Sessions, MD   Chief Complaint  Patient presents with  . Colonoscopy    HPI:  Andrea Gregory is a 57 y.o. female here to schedule a colonoscopy. Her last colonoscopy was greater than 10 years ago, she denies any history of polyps. She had a paternal grandmother who had colon cancer at an advanced age. Two paternal aunts who had breast cancer, her sister had ovarian cancer. Major heartburn and regurgitation, nausea for the past one month. Mostly late afternoon, sometimes right after eat, doesn't matter what eats. Water does it. Very little esophageal dysphagia to solid foods. She also has had to cough up meat 30 minutes after meal. No abdominal pain. Takes TUMS regularly. Used to take Nexium long time ago. Ibuprofen no more than once per week. No ASA. No constipation, diarrhea, melena, rectal bleeding, weight loss.  Current Outpatient Prescriptions  Medication Sig Dispense Refill  . albuterol (PROVENTIL HFA;VENTOLIN HFA) 108 (90 BASE) MCG/ACT inhaler Inhale 2 puffs into the lungs every 6 (six) hours as needed for wheezing.  1 Inhaler  1  . atorvastatin (LIPITOR) 20 MG tablet take 1 tablet by mouth once daily  90 tablet  3  . buPROPion (WELLBUTRIN XL) 300 MG 24 hr tablet take 1 tablet by mouth once daily  90 tablet  3  . escitalopram (LEXAPRO) 20 MG tablet Take 1 tablet (20 mg total) by mouth daily.  90 tablet  3  . LORazepam (ATIVAN) 1 MG tablet Take 1 tablet (1 mg total) by mouth at bedtime as needed for anxiety.  30 tablet  5   No current facility-administered medications for this visit.    Allergies as of 03/05/2013  . (No Known Allergies)    Past Medical History  Diagnosis Date  . INSOMNIA, CHRONIC 08/25/2009  . MIGRAINE HEADACHE 08/25/2009  . URI 10/22/2009  . Acute bronchitis 08/25/2009  . SPRAIN AND STRAIN OF UNSPECIFIED SITE OF HAND 03/31/2010  . Hyperlipidemia   . Depression     Past Surgical  History  Procedure Laterality Date  . Appendectomy  2002  . Abdominal hysterectomy  1984  . Tonsillectomy    . Dilation and curettage of uterus  1983  . Miscarriage  1978  . Bladder suspension  2005    Family History  Problem Relation Age of Onset  . Diabetes Other   . Hypertension Other   . Cancer Sister     ovarian  . Stroke Other   . Miscarriages / Stillbirths Other   . Alcohol abuse Other   . Mental illness Other   . Colon cancer Paternal Grandmother     age >75  . Breast cancer Paternal Aunt     two  . Lung cancer Neg Hx     History   Social History  . Marital Status: Divorced    Spouse Name: N/A    Number of Children: N/A  . Years of Education: N/A   Occupational History  . Not on file.   Social History Main Topics  . Smoking status: Current Every Day Smoker -- 0.50 packs/day for 35 years    Types: Cigarettes  . Smokeless tobacco: Not on file  . Alcohol Use: Not on file  . Drug Use: Not on file  . Sexually Active: Not on file   Other Topics Concern  . Not on file   Social History Narrative  . No narrative on file  ROS:  General: Negative for anorexia, weight loss, fever, chills, fatigue, weakness. Eyes: Negative for vision changes.  ENT: Negative for hoarseness, difficulty swallowing , nasal congestion. CV: Negative for chest pain, angina, palpitations, dyspnea on exertion, peripheral edema.  Respiratory: Negative for dyspnea at rest, dyspnea on exertion, cough, sputum, wheezing.  GI: See history of present illness. GU:  Negative for dysuria, hematuria, urinary incontinence, urinary frequency, nocturnal urination.  MS: Negative for joint pain, low back pain.  Derm: Negative for rash or itching.  Neuro: Negative for weakness, abnormal sensation, seizure, frequent headaches, memory loss, confusion.  Psych: Negative for anxiety, depression, suicidal ideation, hallucinations.  Endo: Negative for unusual weight change.  Heme: Negative for  bruising or bleeding. Allergy: Negative for rash or hives.    Physical Examination:  BP 120/78  Pulse 71  Temp(Src) 97.8 F (36.6 C) (Oral)  Ht 5\' 5"  (1.651 m)  Wt 163 lb 6.4 oz (74.118 kg)  BMI 27.19 kg/m2   General: Well-nourished, well-developed in no acute distress.  Head: Normocephalic, atraumatic.   Eyes: Conjunctiva pink, no icterus. Mouth: Oropharyngeal mucosa moist and pink , no lesions erythema or exudate. Neck: Supple without thyromegaly, masses, or lymphadenopathy.  Lungs: Clear to auscultation bilaterally.  Heart: Regular rate and rhythm, no murmurs rubs or gallops.  Abdomen: Bowel sounds are normal, nontender, nondistended, no hepatosplenomegaly or masses, no abdominal bruits or    hernia , no rebound or guarding.   Rectal: defer Extremities: No lower extremity edema. No clubbing or deformities.  Neuro: Alert and oriented x 4 , grossly normal neurologically.  Skin: Warm and dry, no rash or jaundice.   Psych: Alert and cooperative, normal mood and affect.

## 2013-03-13 ENCOUNTER — Other Ambulatory Visit: Payer: Self-pay | Admitting: Internal Medicine

## 2013-03-13 MED ORDER — SOD PICOSULFATE-MAG OX-CIT ACD 10-3.5-12 MG-GM-GM PO PACK: 1.0000 | PACK | ORAL | Status: DC

## 2013-03-14 ENCOUNTER — Encounter (HOSPITAL_COMMUNITY): Payer: Self-pay | Admitting: Pharmacy Technician

## 2013-03-28 ENCOUNTER — Encounter (HOSPITAL_COMMUNITY)
Admission: RE | Disposition: A | Discharge status: Home / Self Care | Payer: Self-pay | Source: Ambulatory Visit | Attending: Internal Medicine

## 2013-03-28 ENCOUNTER — Ambulatory Visit (HOSPITAL_COMMUNITY)
Admission: RE | Admit: 2013-03-28 | Discharge: 2013-03-28 | Disposition: A | Discharge status: Home / Self Care | Payer: BC Managed Care – PPO | Source: Ambulatory Visit | Attending: Internal Medicine | Admitting: Internal Medicine

## 2013-03-28 ENCOUNTER — Encounter (HOSPITAL_COMMUNITY): Payer: Self-pay | Admitting: *Deleted

## 2013-03-28 DIAGNOSIS — R1310 Dysphagia, unspecified: Secondary | ICD-10-CM

## 2013-03-28 DIAGNOSIS — Z1211 Encounter for screening for malignant neoplasm of colon: Secondary | ICD-10-CM | POA: Insufficient documentation

## 2013-03-28 DIAGNOSIS — K210 Gastro-esophageal reflux disease with esophagitis: Secondary | ICD-10-CM

## 2013-03-28 DIAGNOSIS — K219 Gastro-esophageal reflux disease without esophagitis: Secondary | ICD-10-CM

## 2013-03-28 DIAGNOSIS — K5730 Diverticulosis of large intestine without perforation or abscess without bleeding: Secondary | ICD-10-CM

## 2013-03-28 DIAGNOSIS — E785 Hyperlipidemia, unspecified: Secondary | ICD-10-CM | POA: Insufficient documentation

## 2013-03-28 DIAGNOSIS — K449 Diaphragmatic hernia without obstruction or gangrene: Secondary | ICD-10-CM

## 2013-03-28 DIAGNOSIS — Z800 Family history of malignant neoplasm of digestive organs: Secondary | ICD-10-CM

## 2013-03-28 DIAGNOSIS — K222 Esophageal obstruction: Secondary | ICD-10-CM

## 2013-03-28 HISTORY — PX: COLONOSCOPY WITH ESOPHAGOGASTRODUODENOSCOPY (EGD): SHX5779

## 2013-03-28 HISTORY — PX: MALONEY DILATION: SHX5535

## 2013-03-28 HISTORY — DX: Other seasonal allergic rhinitis: J30.2

## 2013-03-28 HISTORY — PX: SAVORY DILATION: SHX5439

## 2013-03-28 SURGERY — COLONOSCOPY WITH ESOPHAGOGASTRODUODENOSCOPY (EGD)
Anesthesia: Moderate Sedation

## 2013-03-28 MED ORDER — MEPERIDINE HCL 100 MG/ML IJ SOLN
INTRAMUSCULAR | Status: AC
  Filled 2013-03-28: qty 1

## 2013-03-28 MED ORDER — STERILE WATER FOR IRRIGATION IR SOLN
Status: DC | PRN
  Administered 2013-03-28: 08:00:00

## 2013-03-28 MED ORDER — ONDANSETRON HCL 4 MG/2ML IJ SOLN
INTRAMUSCULAR | Status: AC
  Filled 2013-03-28: qty 2

## 2013-03-28 MED ORDER — PROMETHAZINE HCL 25 MG/ML IJ SOLN
INTRAMUSCULAR | Status: AC
  Filled 2013-03-28: qty 1

## 2013-03-28 MED ORDER — PROMETHAZINE HCL 25 MG/ML IJ SOLN
25.0000 mg | Freq: Once | INTRAMUSCULAR | Status: AC
  Administered 2013-03-28: 25 mg via INTRAVENOUS

## 2013-03-28 MED ORDER — MIDAZOLAM HCL 5 MG/5ML IJ SOLN
INTRAMUSCULAR | Status: AC
  Filled 2013-03-28: qty 10

## 2013-03-28 MED ORDER — BUTAMBEN-TETRACAINE-BENZOCAINE 2-2-14 % EX AERO
INHALATION_SPRAY | CUTANEOUS | Status: DC | PRN
  Administered 2013-03-28: 2 via TOPICAL

## 2013-03-28 MED ORDER — ONDANSETRON HCL 4 MG/2ML IJ SOLN
INTRAMUSCULAR | Status: DC | PRN
  Administered 2013-03-28: 4 mg via INTRAVENOUS

## 2013-03-28 MED ORDER — SODIUM CHLORIDE 0.9 % IJ SOLN
INTRAMUSCULAR | Status: AC
  Filled 2013-03-28: qty 10

## 2013-03-28 MED ORDER — SODIUM CHLORIDE 0.9 % IV SOLN
INTRAVENOUS | Status: DC
  Administered 2013-03-28: 1000 mL via INTRAVENOUS

## 2013-03-28 MED ORDER — MEPERIDINE HCL 100 MG/ML IJ SOLN
INTRAMUSCULAR | Status: DC | PRN
  Administered 2013-03-28 (×2): 50 mg via INTRAVENOUS

## 2013-03-28 MED ORDER — MIDAZOLAM HCL 5 MG/5ML IJ SOLN
INTRAMUSCULAR | Status: DC | PRN
  Administered 2013-03-28 (×2): 2 mg via INTRAVENOUS

## 2013-03-28 NOTE — Op Note (Signed)
[facility] [address] [place], [address]   ENDOSCOPY PROCEDURE REPORT  PATIENT: Andrea Gregory, Andrea Gregory  MR#: [id number] BIRTHDATE: 08/30/1956 , 56  yrs. old GENDER: Female ENDOSCOPIST: R.  Roetta Sessions, MD FACP FACG REFERRED BY:  [name], M.D. PROCEDURE DATE:  03/28/2013 PROCEDURE:     EGD with Elease Hashimoto dilation  INDICATIONS:     Worsening GERD symptoms; esophageal dysphagia  INFORMED CONSENT:   The risks, benefits, limitations, alternatives and imponderables have been discussed.  The potential for biopsy, esophogeal dilation, etc. have also been reviewed.  Questions have been answered.  All parties agreeable.  Please see the history and physical in the medical record for more information.  MEDICATIONS:  Versed 4 mg IV and Demerol 100 mg IV in divided doses. Phenergan 25 mg IV and Zofran 4 mg IV. Cetacaine spray.  DESCRIPTION OF PROCEDURE:   The EG-2990i ([id number])  endoscope was introduced through the mouth and advanced to the second portion of the duodenum without difficulty or limitations.  The mucosal surfaces were surveyed very carefully during advancement of the scope and upon withdrawal.  Retroflexion view of the proximal stomach and esophagogastric junction was performed.      FINDINGS:  Circumferential distal esophageal erosions with one 3 mm area of ulceration. Noncritical. Schatzki's ring present. No Barrett's esophagus. Stomach empty. Patient has a small hiatal hernia. Gastric mucosa otherwise appeared normal. Patent pylorus. Normal first and second portion of the duodenum  THERAPEUTIC / DIAGNOSTIC MANEUVERS PERFORMED:  A 54 French Maloney dilators passed to full insertion easily. A look back revealed ring remained intact. Subsequently, a 13 French Maloney dilators passed easily to full insertion.  A look back revealed the ring had been dilated.  There was a very small tear through the UES.   COMPLICATIONS:  None  IMPRESSION:   Ulcerative reflux esophagitis. Schatzki's ring-status post dilation as described above. Small hiatal hernia.  RECOMMENDATIONS:  Begin Protonix 40 mg daily. Antireflux diet/literature provided. See colonoscopy report.    _______________________________ R. Roetta Sessions, MD FACP [facility] eSigned:  R. Roetta Sessions, MD FACP [facility] 03/28/2013 8:59 AM     CC:  PATIENT NAME:  Andrea Gregory, Andrea Gregory MR#: [id number]

## 2013-03-28 NOTE — Interval H&P Note (Signed)
History and Physical Interval Note:  03/28/2013 8:33 AM  Andrea Gregory  has presented today for surgery, with the diagnosis of GERD, Family Hx of Colon Rectal Cancer, Esophageal Dysphagia  The various methods of treatment have been discussed with the patient and family. After consideration of risks, benefits and other options for treatment, the patient has consented to  Procedure(s) with comments: COLONOSCOPY WITH ESOPHAGOGASTRODUODENOSCOPY (EGD) (N/A) - 8:30-moved to [name] to notify pt MALONEY DILATION (N/A) SAVORY DILATION (N/A) as a surgical intervention .  The patient's history has been reviewed, patient examined, no change in status, stable for surgery.  I have reviewed the patient's chart and labs.  Questions were answered to the patient's satisfaction.     [name]  Patient seen and examined. EGD with possible esophageal dilation and colonoscopy per plan.  The risks, benefits, limitations, imponderables and alternatives regarding both EGD and colonoscopy have been reviewed with the patient. Questions have been answered. All parties agreeable.

## 2013-03-28 NOTE — Op Note (Signed)
[facility] [address] [place], [address]   COLONOSCOPY PROCEDURE REPORT  PATIENT: Andrea, Gregory  MR#:         [id number] BIRTHDATE: 1956-07-30 , 56  yrs. old GENDER: Female ENDOSCOPIST: R.  Roetta Sessions, MD FACP FACG REFERRED BY:  [name], M.D. PROCEDURE DATE:  03/28/2013 PROCEDURE:     Screening colonoscopy  INDICATIONS: Average risk colorectal cancer screening  INFORMED CONSENT:  The risks, benefits, alternatives and imponderables including but not limited to bleeding, perforation as well as the possibility of a missed lesion have been reviewed.  The potential for biopsy, lesion removal, etc. have also been discussed.  Questions have been answered.  All parties agreeable. Please see the history and physical in the medical record for more information.  MEDICATIONS: Versed 4 mg IV and Demerol 100 mg IV in divided doses. Phenergan 25 mg IV and Zofran 4 mg IV  DESCRIPTION OF PROCEDURE:  After a digital rectal exam was performed, the EG-2990i ([id number]) and EC-[id number] ([id number]) colonoscope was advanced from the anus through the rectum and colon to the area of the cecum, ileocecal valve and appendiceal orifice. The cecum was deeply intubated.  These structures were well-seen and photographed for the record.  From the level of the cecum and ileocecal valve, the scope was slowly and cautiously withdrawn. The mucosal surfaces were carefully surveyed utilizing scope tip deflection to facilitate fold flattening as needed.  The scope was pulled down into the rectum where a thorough examination including retroflexion was performed.    FINDINGS:  Adequate preparation. Normal rectum. Scattered left-sided diverticula; otherwise, the remainder of the colonic mucosa appeared normal.  THERAPEUTIC / DIAGNOSTIC MANEUVERS PERFORMED:  None  COMPLICATIONS: None  CECAL WITHDRAWAL TIME:  9 minutes  IMPRESSION:  Colonic diverticulosis  RECOMMENDATIONS: Repeat  screening colonoscopy in 10 years   _______________________________ eSigned:  R. Roetta Sessions, MD FACP [facility] 03/28/2013 9:20 AM   CC:

## 2013-03-28 NOTE — H&P (View-Only) (Signed)
Primary Care Physician:  BURCHETTE,BRUCE W, MD  Primary Gastroenterologist:  Michael Rourk, MD   Chief Complaint  Patient presents with  . Colonoscopy    HPI:  Andrea Gregory is a 56 y.o. female here to schedule a colonoscopy. Her last colonoscopy was greater than 10 years ago, she denies any history of polyps. She had a paternal grandmother who had colon cancer at an advanced age. Two paternal aunts who had breast cancer, her sister had ovarian cancer. Major heartburn and regurgitation, nausea for the past one month. Mostly late afternoon, sometimes right after eat, doesn't matter what eats. Water does it. Very little esophageal dysphagia to solid foods. She also has had to cough up meat 30 minutes after meal. No abdominal pain. Takes TUMS regularly. Used to take Nexium long time ago. Ibuprofen no more than once per week. No ASA. No constipation, diarrhea, melena, rectal bleeding, weight loss.  Current Outpatient Prescriptions  Medication Sig Dispense Refill  . albuterol (PROVENTIL HFA;VENTOLIN HFA) 108 (90 BASE) MCG/ACT inhaler Inhale 2 puffs into the lungs every 6 (six) hours as needed for wheezing.  1 Inhaler  1  . atorvastatin (LIPITOR) 20 MG tablet take 1 tablet by mouth once daily  90 tablet  3  . buPROPion (WELLBUTRIN XL) 300 MG 24 hr tablet take 1 tablet by mouth once daily  90 tablet  3  . escitalopram (LEXAPRO) 20 MG tablet Take 1 tablet (20 mg total) by mouth daily.  90 tablet  3  . LORazepam (ATIVAN) 1 MG tablet Take 1 tablet (1 mg total) by mouth at bedtime as needed for anxiety.  30 tablet  5   No current facility-administered medications for this visit.    Allergies as of 03/05/2013  . (No Known Allergies)    Past Medical History  Diagnosis Date  . INSOMNIA, CHRONIC 08/25/2009  . MIGRAINE HEADACHE 08/25/2009  . URI 10/22/2009  . Acute bronchitis 08/25/2009  . SPRAIN AND STRAIN OF UNSPECIFIED SITE OF HAND 03/31/2010  . Hyperlipidemia   . Depression     Past Surgical  History  Procedure Laterality Date  . Appendectomy  2002  . Abdominal hysterectomy  1984  . Tonsillectomy    . Dilation and curettage of uterus  1983  . Miscarriage  1978  . Bladder suspension  2005    Family History  Problem Relation Age of Onset  . Diabetes Other   . Hypertension Other   . Cancer Sister     ovarian  . Stroke Other   . Miscarriages / Stillbirths Other   . Alcohol abuse Other   . Mental illness Other   . Colon cancer Paternal Grandmother     age >60  . Breast cancer Paternal Aunt     two  . Lung cancer Neg Hx     History   Social History  . Marital Status: Divorced    Spouse Name: N/A    Number of Children: N/A  . Years of Education: N/A   Occupational History  . Not on file.   Social History Main Topics  . Smoking status: Current Every Day Smoker -- 0.50 packs/day for 35 years    Types: Cigarettes  . Smokeless tobacco: Not on file  . Alcohol Use: Not on file  . Drug Use: Not on file  . Sexually Active: Not on file   Other Topics Concern  . Not on file   Social History Narrative  . No narrative on file        ROS:  General: Negative for anorexia, weight loss, fever, chills, fatigue, weakness. Eyes: Negative for vision changes.  ENT: Negative for hoarseness, difficulty swallowing , nasal congestion. CV: Negative for chest pain, angina, palpitations, dyspnea on exertion, peripheral edema.  Respiratory: Negative for dyspnea at rest, dyspnea on exertion, cough, sputum, wheezing.  GI: See history of present illness. GU:  Negative for dysuria, hematuria, urinary incontinence, urinary frequency, nocturnal urination.  MS: Negative for joint pain, low back pain.  Derm: Negative for rash or itching.  Neuro: Negative for weakness, abnormal sensation, seizure, frequent headaches, memory loss, confusion.  Psych: Negative for anxiety, depression, suicidal ideation, hallucinations.  Endo: Negative for unusual weight change.  Heme: Negative for  bruising or bleeding. Allergy: Negative for rash or hives.    Physical Examination:  BP 120/78  Pulse 71  Temp(Src) 97.8 F (36.6 C) (Oral)  Ht 5' 5" (1.651 m)  Wt 163 lb 6.4 oz (74.118 kg)  BMI 27.19 kg/m2   General: Well-nourished, well-developed in no acute distress.  Head: Normocephalic, atraumatic.   Eyes: Conjunctiva pink, no icterus. Mouth: Oropharyngeal mucosa moist and pink , no lesions erythema or exudate. Neck: Supple without thyromegaly, masses, or lymphadenopathy.  Lungs: Clear to auscultation bilaterally.  Heart: Regular rate and rhythm, no murmurs rubs or gallops.  Abdomen: Bowel sounds are normal, nontender, nondistended, no hepatosplenomegaly or masses, no abdominal bruits or    hernia , no rebound or guarding.   Rectal: defer Extremities: No lower extremity edema. No clubbing or deformities.  Neuro: Alert and oriented x 4 , grossly normal neurologically.  Skin: Warm and dry, no rash or jaundice.   Psych: Alert and cooperative, normal mood and affect.    

## 2013-04-01 ENCOUNTER — Encounter (HOSPITAL_COMMUNITY): Payer: Self-pay | Admitting: Internal Medicine

## 2013-06-05 NOTE — Progress Notes (Signed)
REVIEWED.  

## 2013-07-08 ENCOUNTER — Encounter: Payer: Self-pay | Admitting: Family Medicine

## 2013-07-08 ENCOUNTER — Ambulatory Visit (INDEPENDENT_AMBULATORY_CARE_PROVIDER_SITE_OTHER): Payer: BC Managed Care – PPO | Admitting: Family Medicine

## 2013-07-08 VITALS — BP 124/80 | HR 74 | Temp 98.1°F | Wt 164.0 lb

## 2013-07-08 DIAGNOSIS — F339 Major depressive disorder, recurrent, unspecified: Secondary | ICD-10-CM

## 2013-07-08 DIAGNOSIS — Z23 Encounter for immunization: Secondary | ICD-10-CM

## 2013-07-08 DIAGNOSIS — E785 Hyperlipidemia, unspecified: Secondary | ICD-10-CM

## 2013-07-08 LAB — HEPATIC FUNCTION PANEL
ALT: 14 U/L (ref 0–35)
Total Protein: 6.9 g/dL (ref 6.0–8.3)

## 2013-07-08 LAB — LIPID PANEL
Cholesterol: 257 mg/dL — ABNORMAL HIGH (ref 0–200)
HDL: 51.4 mg/dL (ref >39.00)
Triglycerides: 151 mg/dL — ABNORMAL HIGH (ref 0.0–149.0)

## 2013-07-08 LAB — LDL CHOLESTEROL, DIRECT: Direct LDL: 187.6 mg/dL

## 2013-07-08 NOTE — Progress Notes (Signed)
Subjective:    Patient ID: Andrea Gregory, female    DOB: Jul 21, 1956, 57 y.o.   MRN: [id number]  HPI Medical followup  Patient has history of hyperlipidemia and depression. She's had occasional muscle aches with Lipitor and has reduced taking this to only once or twice a week. She does not have any history of peripheral vascular disease or coronary disease.  She did quit smoking altogether last visit but has resumed about one to 2 cigarettes per day. Recently engaged in dealing with stress issues as she is getting things arranged. Her depression is stable. She remains on Wellbutrin and Lexapro. She continues to take lorazepam at night for insomnia.  No consistent alcohol use.  Past Medical History  Diagnosis Date  . INSOMNIA, CHRONIC 08/25/2009  . MIGRAINE HEADACHE 08/25/2009  . URI 10/22/2009  . Acute bronchitis 08/25/2009  . SPRAIN AND STRAIN OF UNSPECIFIED SITE OF HAND 03/31/2010    pt states that she doesnt have this hx  . Hyperlipidemia   . Depression   . Seasonal allergies    Past Surgical History  Procedure Laterality Date  . Appendectomy  2002  . Abdominal hysterectomy  1984  . Tonsillectomy    . Dilation and curettage of uterus  1983  . Miscarriage  1978  . Bladder suspension  2005  . Exploratory laprotomy  1994  . Colonoscopy with esophagogastroduodenoscopy (egd) N/A 03/28/2013    Procedure: COLONOSCOPY WITH ESOPHAGOGASTRODUODENOSCOPY (EGD);  Surgeon: Corbin Ade, MD;  Location: [facility];  Service: Endoscopy;  Laterality: N/A;  8:30-moved to [name] to notify pt  . Maloney dilation N/A 03/28/2013    Procedure: Elease Hashimoto DILATION;  Surgeon: Corbin Ade, MD;  Location: [facility];  Service: Endoscopy;  Laterality: N/A;  . Savory dilation N/A 03/28/2013    Procedure: SAVORY DILATION;  Surgeon: Corbin Ade, MD;  Location: [facility];  Service: Endoscopy;  Laterality: N/A;    reports that she has been smoking Cigarettes.  She has a 8.75 pack-year smoking  history. She does not have any smokeless tobacco history on file. She reports that she does not drink alcohol or use illicit drugs. family history includes Alcohol abuse in her other; Breast cancer in her paternal aunt; Cancer in her sister; Colon cancer in her paternal grandmother; Diabetes in her other; Hypertension in her other; Mental illness in her other; Miscarriages / Stillbirths in her other; Stroke in her other. There is no history of Lung cancer. No Known Allergies    Review of Systems  Constitutional: Negative for fever, chills, appetite change and unexpected weight change.  Respiratory: Negative for cough and shortness of breath.   Cardiovascular: Negative for chest pain, palpitations and leg swelling.  Psychiatric/Behavioral: Negative for dysphoric mood.       Objective:   Physical Exam  Constitutional: She appears well-developed and well-nourished.  Neck: Neck supple. No thyromegaly present.  Cardiovascular: Normal rate and regular rhythm.   Pulmonary/Chest: Effort normal and breath sounds normal. No respiratory distress. She has no wheezes. She has no rales.  Musculoskeletal: She exhibits no edema.  Lymphadenopathy:    She has no cervical adenopathy.          Assessment & Plan:  #1 hyperlipidemia. Poor compliance with therapy. Recheck lipid panel. #2 history of recurrent depression. Stable on current medications. She's had at least 3 episodes of depression. Recommend indefinite antidepressant medication #3 nicotine use. We counseled her regarding cessation altogether. She's never had Pneumovax. She agrees  and this was given today

## 2013-07-23 ENCOUNTER — Other Ambulatory Visit: Payer: Self-pay | Admitting: Family Medicine

## 2013-07-24 ENCOUNTER — Other Ambulatory Visit: Payer: Self-pay | Admitting: Family Medicine

## 2013-07-24 NOTE — Telephone Encounter (Signed)
Refill for 6 months. 

## 2013-07-24 NOTE — Telephone Encounter (Signed)
Last refill 01/12/13 #30 5 refill

## 2013-10-02 ENCOUNTER — Encounter: Payer: Self-pay | Admitting: Internal Medicine

## 2013-11-14 ENCOUNTER — Other Ambulatory Visit: Payer: Self-pay | Admitting: Internal Medicine

## 2014-01-12 ENCOUNTER — Telehealth: Payer: Self-pay | Admitting: Family Medicine

## 2014-01-13 NOTE — Telephone Encounter (Signed)
Refill for 6 months. 

## 2014-01-13 NOTE — Telephone Encounter (Signed)
Last visit 07/08/13 Last refill 07/23/13 #30 5

## 2014-01-15 MED ORDER — LORAZEPAM 1 MG PO TABS: ORAL_TABLET | ORAL | Status: DC

## 2014-01-15 NOTE — Addendum Note (Signed)
Addended by: Marcina Millard on: 01/15/2014 10:06 AM   Modules accepted: Orders

## 2014-01-15 NOTE — Telephone Encounter (Signed)
RX faxed to pharmacy.

## 2014-01-15 NOTE — Telephone Encounter (Addendum)
Pt states pharm does not have this med. Can you resend? LORazepam (ATIVAN) 1 MG tablet Dr B approved 6 mos. Pt is out [facility], [place]

## 2014-03-13 ENCOUNTER — Other Ambulatory Visit: Payer: Self-pay | Admitting: Family Medicine

## 2014-03-26 ENCOUNTER — Other Ambulatory Visit: Payer: Self-pay | Admitting: Family Medicine

## 2014-06-26 ENCOUNTER — Telehealth: Payer: Self-pay | Admitting: Family Medicine

## 2014-06-26 NOTE — Telephone Encounter (Signed)
Pt would like to get a bone density scan. Pt needs an order. Pt states dr Elease Hashimoto  Has reccommended pt to get on/ pt needs asap before her insurance runs out at the end of the month.

## 2014-06-27 ENCOUNTER — Other Ambulatory Visit: Payer: Self-pay | Admitting: Family Medicine

## 2014-06-27 DIAGNOSIS — Z13820 Encounter for screening for osteoporosis: Secondary | ICD-10-CM

## 2014-06-27 NOTE — Telephone Encounter (Signed)
Referral is ordered

## 2014-06-27 NOTE — Telephone Encounter (Signed)
OK to set up DEXA scan.

## 2014-07-03 ENCOUNTER — Encounter: Payer: Self-pay | Admitting: *Deleted

## 2014-07-03 ENCOUNTER — Other Ambulatory Visit (INDEPENDENT_AMBULATORY_CARE_PROVIDER_SITE_OTHER): Payer: BC Managed Care – PPO

## 2014-07-03 DIAGNOSIS — Z0000 Encounter for general adult medical examination without abnormal findings: Secondary | ICD-10-CM

## 2014-07-03 LAB — LIPID PANEL
CHOL/HDL RATIO: 6
CHOLESTEROL: 264 mg/dL — AB (ref 0–200)
HDL: 41.1 mg/dL (ref >39.00)
NONHDL: 222.9
Triglycerides: 226 mg/dL — ABNORMAL HIGH (ref 0.0–149.0)
VLDL: 45.2 mg/dL — ABNORMAL HIGH (ref 0.0–40.0)

## 2014-07-03 LAB — CBC WITH DIFFERENTIAL/PLATELET
Basophils Absolute: 0 10*3/uL (ref 0.0–0.1)
Basophils Relative: 0.4 % (ref 0.0–3.0)
EOS ABS: 0.2 10*3/uL (ref 0.0–0.7)
EOS PCT: 2.3 % (ref 0.0–5.0)
HEMATOCRIT: 40.9 % (ref 36.0–46.0)
Hemoglobin: 13.6 g/dL (ref 12.0–15.0)
LYMPHS ABS: 2.8 10*3/uL (ref 0.7–4.0)
Lymphocytes Relative: 36.7 % (ref 12.0–46.0)
MCHC: 33.2 g/dL (ref 30.0–36.0)
MCV: 84.8 fl (ref 78.0–100.0)
MONO ABS: 0.4 10*3/uL (ref 0.1–1.0)
Monocytes Relative: 5.7 % (ref 3.0–12.0)
Neutro Abs: 4.2 10*3/uL (ref 1.4–7.7)
Neutrophils Relative %: 54.9 % (ref 43.0–77.0)
PLATELETS: 290 10*3/uL (ref 150.0–400.0)
RBC: 4.83 Mil/uL (ref 3.87–5.11)
RDW: 14.4 % (ref 11.5–15.5)
WBC: 7.6 10*3/uL (ref 4.0–10.5)

## 2014-07-03 LAB — HEPATIC FUNCTION PANEL
ALT: 14 U/L (ref 0–35)
AST: 15 U/L (ref 0–37)
Albumin: 3.8 g/dL (ref 3.5–5.2)
Alkaline Phosphatase: 73 U/L (ref 39–117)
BILIRUBIN TOTAL: 0.4 mg/dL (ref 0.2–1.2)
Bilirubin, Direct: 0 mg/dL (ref 0.0–0.3)
Total Protein: 6.8 g/dL (ref 6.0–8.3)

## 2014-07-03 LAB — POCT URINALYSIS DIPSTICK
BILIRUBIN UA: NEGATIVE
Blood, UA: NEGATIVE
GLUCOSE UA: NEGATIVE
Ketones, UA: NEGATIVE
Leukocytes, UA: NEGATIVE
NITRITE UA: NEGATIVE
SPEC GRAV UA: 1.02
UROBILINOGEN UA: 0.2
pH, UA: 6

## 2014-07-03 LAB — BASIC METABOLIC PANEL
BUN: 13 mg/dL (ref 6–23)
CHLORIDE: 106 meq/L (ref 96–112)
CO2: 26 meq/L (ref 19–32)
CREATININE: 0.8 mg/dL (ref 0.4–1.2)
Calcium: 9.6 mg/dL (ref 8.4–10.5)
GFR: 73.73 mL/min (ref >60.00)
GLUCOSE: 82 mg/dL (ref 70–99)
POTASSIUM: 4 meq/L (ref 3.5–5.1)
Sodium: 139 mEq/L (ref 135–145)

## 2014-07-03 LAB — LDL CHOLESTEROL, DIRECT: LDL DIRECT: 203.9 mg/dL

## 2014-07-03 LAB — TSH: TSH: 0.47 u[IU]/mL (ref 0.35–4.50)

## 2014-07-04 ENCOUNTER — Other Ambulatory Visit: Payer: Self-pay | Admitting: Family Medicine

## 2014-07-07 NOTE — Telephone Encounter (Signed)
Last visit 07/08/13 Last refill 01/15/14 #30 5 refills  Pt has appointment 07/08/14

## 2014-07-07 NOTE — Telephone Encounter (Signed)
Needs office follow up.  May refill for one month.

## 2014-07-09 ENCOUNTER — Encounter: Payer: Self-pay | Admitting: Family Medicine

## 2014-07-09 ENCOUNTER — Ambulatory Visit (INDEPENDENT_AMBULATORY_CARE_PROVIDER_SITE_OTHER): Payer: BC Managed Care – PPO | Admitting: Family Medicine

## 2014-07-09 VITALS — BP 122/80 | HR 82 | Temp 98.0°F | Ht 65.5 in | Wt 164.0 lb

## 2014-07-09 DIAGNOSIS — Z0000 Encounter for general adult medical examination without abnormal findings: Secondary | ICD-10-CM

## 2014-07-09 DIAGNOSIS — Z23 Encounter for immunization: Secondary | ICD-10-CM

## 2014-07-09 MED ORDER — ATORVASTATIN CALCIUM 20 MG PO TABS: 20.0000 mg | ORAL_TABLET | Freq: Every day | ORAL | Status: DC

## 2014-07-09 MED ORDER — LORAZEPAM 1 MG PO TABS: 1.0000 mg | ORAL_TABLET | Freq: Two times a day (BID) | ORAL | Status: DC

## 2014-07-09 NOTE — Progress Notes (Signed)
Subjective:    Patient ID: Andrea Gregory, female    DOB: 09-16-1956, 58 y.o.   MRN: [id number]  HPI Patient seen for complete physical. She sees gynecologist regularly and has followup with them tomorrow. She has hyperlipidemia and quit taking Lipitor several months ago her on. This was not due to side effect. She just ran out of prescription and never got this refilled. She still smokes but only about 6 cigarettes per day. She's had chronic insomnia for several years and has been on lorazepam at night for several years. No history of misuse. No flu vaccine yet.  She just got married this past summer and things are going well. Her job will be ending in the next month and that has caused some stress.  Past Medical History  Diagnosis Date  . INSOMNIA, CHRONIC 08/25/2009  . MIGRAINE HEADACHE 08/25/2009  . URI 10/22/2009  . Acute bronchitis 08/25/2009  . SPRAIN AND STRAIN OF UNSPECIFIED SITE OF HAND 03/31/2010    pt states that she doesnt have this hx  . Hyperlipidemia   . Depression   . Seasonal allergies    Past Surgical History  Procedure Laterality Date  . Appendectomy  2002  . Abdominal hysterectomy  1984  . Tonsillectomy    . Dilation and curettage of uterus  1983  . Miscarriage  1978  . Bladder suspension  2005  . Exploratory laprotomy  1994  . Colonoscopy with esophagogastroduodenoscopy (egd) N/A 03/28/2013    Procedure: COLONOSCOPY WITH ESOPHAGOGASTRODUODENOSCOPY (EGD);  Surgeon: Daneil Dolin, MD;  [facility];  Service: Endoscopy;  Laterality: N/A;  8:30-moved to [place] to notify pt  . Maloney dilation N/A 03/28/2013    Procedure: Venia Minks DILATION;  Surgeon: Daneil Dolin, MD;  [facility];  Service: Endoscopy;  Laterality: N/A;  . Savory dilation N/A 03/28/2013    Procedure: SAVORY DILATION;  Surgeon: Daneil Dolin, MD;  [facility];  Service: Endoscopy;  Laterality: N/A;    reports that she has been smoking Cigarettes.  She has a 8.75  pack-year smoking history. She does not have any smokeless tobacco history on file. She reports that she does not drink alcohol or use illicit drugs. family history includes Alcohol abuse in her other; Breast cancer in her paternal aunt; Cancer in her sister; Colon cancer in her paternal grandmother; Diabetes in her other; Hypertension in her other; Mental illness in her other; 73 / Stillbirths in her other; Stroke in her other. There is no history of Lung cancer. No Known Allergies    Review of Systems  Constitutional: Negative for fever, activity change, appetite change, fatigue and unexpected weight change.  HENT: Negative for ear pain, hearing loss, sore throat and trouble swallowing.   Eyes: Negative for visual disturbance.  Respiratory: Negative for cough and shortness of breath.   Cardiovascular: Negative for chest pain and palpitations.  Gastrointestinal: Negative for abdominal pain, diarrhea, constipation and blood in stool.  Endocrine: Negative for polydipsia and polyuria.  Genitourinary: Negative for dysuria and hematuria.  Musculoskeletal: Negative for arthralgias, back pain and myalgias.  Skin: Negative for rash.  Neurological: Negative for dizziness, syncope and headaches.  Hematological: Negative for adenopathy.  Psychiatric/Behavioral: Negative for confusion and dysphoric mood.       Objective:   Physical Exam  Constitutional: She is oriented to person, place, and time. She appears well-developed and well-nourished.  HENT:  Head: Normocephalic and atraumatic.  Eyes: EOM are normal. Pupils  are equal, round, and reactive to light.  Neck: Normal range of motion. Neck supple. No thyromegaly present.  Cardiovascular: Normal rate, regular rhythm and normal heart sounds.   No murmur heard. Pulmonary/Chest: Breath sounds normal. No respiratory distress. She has no wheezes. She has no rales.  Abdominal: Soft. Bowel sounds are normal. She exhibits no distension. There  is no tenderness. There is no rebound.  Genitourinary:  Per GYN  Musculoskeletal: Normal range of motion. She exhibits no edema.  Lymphadenopathy:    She has no cervical adenopathy.  Neurological: She is alert and oriented to person, place, and time. She displays normal reflexes. No cranial nerve deficit.  Skin: No rash noted.  Psychiatric: She has a normal mood and affect. Her behavior is normal. Judgment and thought content normal.          Assessment & Plan:  Complete physical. She'll continue GYN followup. Labs reviewed. Lipids are poorly controlled. Start back Lipitor with prescription given. Flu vaccine given. Smoking cessation discussed.

## 2014-07-09 NOTE — Progress Notes (Signed)
Pre visit review using our clinic review tool, if applicable. No additional management support is needed unless otherwise documented below in the visit note. 

## 2014-07-10 ENCOUNTER — Other Ambulatory Visit: Payer: Self-pay | Admitting: Obstetrics and Gynecology

## 2014-07-11 LAB — CYTOLOGY - PAP

## 2014-07-16 ENCOUNTER — Ambulatory Visit (HOSPITAL_COMMUNITY): Payer: BC Managed Care – PPO

## 2014-07-18 ENCOUNTER — Encounter: Payer: Self-pay | Admitting: Family Medicine

## 2014-07-31 ENCOUNTER — Ambulatory Visit (HOSPITAL_COMMUNITY)
Admission: RE | Admit: 2014-07-31 | Discharge: 2014-07-31 | Disposition: A | Discharge status: Home / Self Care | Payer: BC Managed Care – PPO | Source: Ambulatory Visit | Attending: Family Medicine | Admitting: Family Medicine

## 2014-07-31 DIAGNOSIS — Z13820 Encounter for screening for osteoporosis: Secondary | ICD-10-CM | POA: Diagnosis not present

## 2014-07-31 DIAGNOSIS — Z780 Asymptomatic menopausal state: Secondary | ICD-10-CM | POA: Insufficient documentation

## 2014-08-13 ENCOUNTER — Encounter: Payer: Self-pay | Admitting: Family Medicine

## 2015-02-18 ENCOUNTER — Ambulatory Visit (INDEPENDENT_AMBULATORY_CARE_PROVIDER_SITE_OTHER): Payer: 59 | Admitting: Family Medicine

## 2015-02-18 ENCOUNTER — Encounter: Payer: Self-pay | Admitting: Family Medicine

## 2015-02-18 VITALS — BP 120/70 | HR 71 | Temp 98.2°F | Wt 171.0 lb

## 2015-02-18 DIAGNOSIS — F411 Generalized anxiety disorder: Secondary | ICD-10-CM | POA: Diagnosis not present

## 2015-02-18 DIAGNOSIS — K219 Gastro-esophageal reflux disease without esophagitis: Secondary | ICD-10-CM

## 2015-02-18 DIAGNOSIS — E785 Hyperlipidemia, unspecified: Secondary | ICD-10-CM | POA: Diagnosis not present

## 2015-02-18 MED ORDER — LORAZEPAM 1 MG PO TABS: 1.0000 mg | ORAL_TABLET | Freq: Two times a day (BID) | ORAL | Status: DC

## 2015-02-18 NOTE — Progress Notes (Signed)
Subjective:    Patient ID: Andrea Gregory, female    DOB: December 10, 1955, 59 y.o.   MRN: [id number]  HPI Patient seen for follow-up regarding several items  Long history of GERD. She takes Protonix. She's tried several times coming off this but had breakthrough reflux symptoms. She has had remote history of esophageal stricture. Only rare episodes of dysphagia. No recent appetite or weight changes.  Hyperlipidemia. Previously treated with atorvastatin. She had myalgias which promptly improved after she stopped taking medication. She is in the process of trying to quit smoking. No family history of premature CAD.  Long history of anxiety. She is also past history of depression which is currently stable. She is on Lexapro and is for several years been on raise up and 0.5 mg twice a day. Requesting refills. Anxiety symptoms are stable.  Past Medical History  Diagnosis Date  . INSOMNIA, CHRONIC 08/25/2009  . MIGRAINE HEADACHE 08/25/2009  . URI 10/22/2009  . Acute bronchitis 08/25/2009  . SPRAIN AND STRAIN OF UNSPECIFIED SITE OF HAND 03/31/2010    pt states that she doesnt have this hx  . Hyperlipidemia   . Depression   . Seasonal allergies    Past Surgical History  Procedure Laterality Date  . Appendectomy  2002  . Abdominal hysterectomy  1984  . Tonsillectomy    . Dilation and curettage of uterus  1983  . Miscarriage  1978  . Bladder suspension  2005  . Exploratory laprotomy  1994  . Colonoscopy with esophagogastroduodenoscopy (egd) N/A 03/28/2013    Procedure: COLONOSCOPY WITH ESOPHAGOGASTRODUODENOSCOPY (EGD);  Surgeon: Daneil Dolin, MD;  [facility];  Service: Endoscopy;  Laterality: N/A;  8:30-moved to [place] to notify pt  . Maloney dilation N/A 03/28/2013    Procedure: Venia Minks DILATION;  Surgeon: Daneil Dolin, MD;  [facility];  Service: Endoscopy;  Laterality: N/A;  . Savory dilation N/A 03/28/2013    Procedure: SAVORY DILATION;  Surgeon: Daneil Dolin,  MD;  [facility];  Service: Endoscopy;  Laterality: N/A;    reports that she has been smoking Cigarettes.  She has a 8.75 pack-year smoking history. She does not have any smokeless tobacco history on file. She reports that she does not drink alcohol or use illicit drugs. family history includes Alcohol abuse in her other; Breast cancer in her paternal aunt; Cancer in her sister; Colon cancer in her paternal grandmother; Diabetes in her other; Hypertension in her other; Mental illness in her other; 60 / Stillbirths in her other; Stroke in her other. There is no history of Lung cancer. No Known Allergies    Review of Systems  Constitutional: Negative for appetite change and unexpected weight change.  HENT: Negative for trouble swallowing.   Respiratory: Negative for cough and shortness of breath.   Cardiovascular: Negative for chest pain.  Gastrointestinal: Negative for abdominal pain.  Neurological: Negative for dizziness.  Psychiatric/Behavioral: Negative for dysphoric mood.       Objective:   Physical Exam  Constitutional: She appears well-developed and well-nourished. No distress.  HENT:  Mouth/Throat: Oropharynx is clear and moist.  Neck: Neck supple. No thyromegaly present.  Cardiovascular: Normal rate and regular rhythm.   Pulmonary/Chest: Effort normal and breath sounds normal. No respiratory distress. She has no wheezes. She has no rales.  Psychiatric: She has a normal mood and affect. Her behavior is normal.          Assessment & Plan:  #1  hyperlipidemia. Patient currently off Lipitor. 10 year risk of CAD event is 7%. After long discussion of risks and benefits of statin use she declines. #2 ongoing nicotine use. Patient has not smoked for 3 days. We discussed strategies for maintenance of smoking cessation #3 history of GERD. Stable on Protonix. #4 history of anxiety and depression stable. Refill lorazepam. Continue Lexapro.

## 2015-02-18 NOTE — Progress Notes (Signed)
Pre visit review using our clinic review tool, if applicable. No additional management support is needed unless otherwise documented below in the visit note. 

## 2015-04-03 ENCOUNTER — Other Ambulatory Visit: Payer: Self-pay | Admitting: Gastroenterology

## 2015-04-03 NOTE — Telephone Encounter (Signed)
rf x 2. Patient needs ov prior to further refills. Hasn't been seen in over two years.

## 2015-04-06 ENCOUNTER — Encounter: Payer: Self-pay | Admitting: Internal Medicine

## 2015-04-06 NOTE — Telephone Encounter (Signed)
APPOINTMENT MADE AND LETTER SENT °

## 2015-04-06 NOTE — Telephone Encounter (Signed)
Stacey, please schedule.  

## 2015-04-14 ENCOUNTER — Ambulatory Visit (INDEPENDENT_AMBULATORY_CARE_PROVIDER_SITE_OTHER): Payer: 59 | Admitting: Gastroenterology

## 2015-04-14 ENCOUNTER — Telehealth: Payer: Self-pay | Admitting: *Deleted

## 2015-04-14 ENCOUNTER — Encounter: Payer: Self-pay | Admitting: Gastroenterology

## 2015-04-14 VITALS — BP 145/91 | HR 66 | Temp 97.4°F | Ht 64.0 in | Wt 170.0 lb

## 2015-04-14 DIAGNOSIS — K2100 Gastro-esophageal reflux disease with esophagitis, without bleeding: Secondary | ICD-10-CM

## 2015-04-14 MED ORDER — PANTOPRAZOLE SODIUM 40 MG PO TBEC: 40.0000 mg | DELAYED_RELEASE_TABLET | Freq: Two times a day (BID) | ORAL | Status: DC

## 2015-04-14 MED ORDER — PROMETHAZINE HCL 25 MG PO TABS: 25.0000 mg | ORAL_TABLET | Freq: Four times a day (QID) | ORAL | Status: DC | PRN

## 2015-04-14 NOTE — Progress Notes (Signed)
Primary Care Physician: Eulas Post, MD  Primary Gastroenterologist:  Garfield Cornea, MD   Chief Complaint  Patient presents with  . Nausea  . Gastrophageal Reflux    HPI: Andrea Gregory is a 59 y.o. female here for follow up of GERD, nausea. Last seen in 02/2013.EGD showed Schatzki ring s/p dilation with superficial tear at UES, ulcerative RE. Colonoscopy with diverticulosis.   Patient recently requested refill on pantoprazole. She ran out for couple of weeks, states her PCP questioned if she should continue long term so she didn't refill her medication. She has had 3-4 episodes of epigastric/substernal pain, then fluid comes up and happens about three times and then goes away. Doesn't feel like burning, more like pressure or heaviness. some nausea. No dysphagia. Takes TUMS and helps sometimes. no pain with exertion. No diaphoresis, SOB. Not related to meals, usually happens in the evening. BM regular. No melena, brbpr. No weight loss.   Current Outpatient Prescriptions  Medication Sig Dispense Refill  . escitalopram (LEXAPRO) 20 MG tablet Take 1 tablet (20 mg total) by mouth daily. 90 tablet 3  . LORazepam (ATIVAN) 1 MG tablet Take 1 tablet (1 mg total) by mouth 2 (two) times daily. 60 tablet 5  . pantoprazole (PROTONIX) 40 MG tablet take 1 tablet by mouth once daily 30 tablet 1   No current facility-administered medications for this visit.    Allergies as of 04/14/2015  . (No Known Allergies)   Past Medical History  Diagnosis Date  . INSOMNIA, CHRONIC 08/25/2009  . MIGRAINE HEADACHE 08/25/2009  . URI 10/22/2009  . Acute bronchitis 08/25/2009  . SPRAIN AND STRAIN OF UNSPECIFIED SITE OF HAND 03/31/2010    pt states that she doesnt have this hx  . Hyperlipidemia   . Depression   . Seasonal allergies    Past Surgical History  Procedure Laterality Date  . Appendectomy  2002  . Abdominal hysterectomy  1984  . Tonsillectomy    . Dilation and curettage of uterus  1983    . Miscarriage  1978  . Bladder suspension  2005  . Exploratory laprotomy  1994  . Colonoscopy with esophagogastroduodenoscopy (egd) N/A 03/28/2013    Procedure: COLONOSCOPY WITH ESOPHAGOGASTRODUODENOSCOPY (EGD);  Surgeon: Daneil Dolin, MD;  [facility];  Service: Endoscopy;  Laterality: N/A;  8:30-moved to [place] to notify pt  . Maloney dilation N/A 03/28/2013    Procedure: Venia Minks DILATION;  Surgeon: Daneil Dolin, MD;  [facility];  Service: Endoscopy;  Laterality: N/A;  . Savory dilation N/A 03/28/2013    Procedure: SAVORY DILATION;  Surgeon: Daneil Dolin, MD;  [facility];  Service: Endoscopy;  Laterality: N/A;     ROS:  General: Negative for anorexia, weight loss, fever, chills, fatigue, weakness. ENT: Negative for hoarseness, difficulty swallowing , nasal congestion. CV: Negative for chest pain, angina, palpitations, dyspnea on exertion, peripheral edema.  Respiratory: Negative for dyspnea at rest, dyspnea on exertion, cough, sputum, wheezing.  GI: See history of present illness. GU:  Negative for dysuria, hematuria, urinary incontinence, urinary frequency, nocturnal urination.  Endo: Negative for unusual weight change.    Physical Examination:   BP 145/91 mmHg  Pulse 66  Temp(Src) 97.4 F (36.3 C)  Ht 5\' 4"  (1.626 m)  Wt 170 lb (77.111 kg)  BMI 29.17 kg/m2  General: Well-nourished, well-developed in no acute distress.  Eyes: No icterus. Mouth: Oropharyngeal mucosa moist and pink , no lesions  erythema or exudate. Lungs: Clear to auscultation bilaterally.  Heart: Regular rate and rhythm, no murmurs rubs or gallops.  Abdomen: Bowel sounds are normal, nontender, nondistended, no hepatosplenomegaly or masses, no abdominal bruits or hernia , no rebound or guarding.   Extremities: No lower extremity edema. No clubbing or deformities. Neuro: Alert and oriented x 4   Skin: Warm and dry, no jaundice.   Psych: Alert and cooperative,  normal mood and affect.  Labs:  Lab Results  Component Value Date   WBC 7.6 07/03/2014   HGB 13.6 07/03/2014   HCT 40.9 07/03/2014   MCV 84.8 07/03/2014   PLT 290.0 07/03/2014   Lab Results  Component Value Date   CREATININE 0.8 07/03/2014   BUN 13 07/03/2014   NA 139 07/03/2014   K 4.0 07/03/2014   CL 106 07/03/2014   CO2 26 07/03/2014   Lab Results  Component Value Date   ALT 14 07/03/2014   AST 15 07/03/2014   ALKPHOS 73 07/03/2014   BILITOT 0.4 07/03/2014   Lab Results  Component Value Date   TSH 0.47 07/03/2014    Imaging Studies: No results found.

## 2015-04-14 NOTE — Patient Instructions (Signed)
Increase pantoprazole to twice daily before a meal for the next few weeks. Once symptoms under control, then go back to once daily.  Please call in one week to let us know how you are doing or call sooner if needed.   If you notice pain related to activity or exertion, shortness of breath, or get sweaty with the pain, you should go straight to the ER to rule out heart issues.   Gastroesophageal Reflux Disease, Adult Gastroesophageal reflux disease (GERD) happens when acid from your stomach flows up into the esophagus. When acid comes in contact with the esophagus, the acid causes soreness (inflammation) in the esophagus. Over time, GERD may create small holes (ulcers) in the lining of the esophagus. CAUSES   Increased body weight. This puts pressure on the stomach, making acid rise from the stomach into the esophagus.  Smoking. This increases acid production in the stomach.  Drinking alcohol. This causes decreased pressure in the lower esophageal sphincter (valve or ring of muscle between the esophagus and stomach), allowing acid from the stomach into the esophagus.  Late evening meals and a full stomach. This increases pressure and acid production in the stomach.  A malformed lower esophageal sphincter. Sometimes, no cause is found. SYMPTOMS   Burning pain in the lower part of the mid-chest behind the breastbone and in the mid-stomach area. This may occur twice a week or more often.  Trouble swallowing.  Sore throat.  Dry cough.  Asthma-like symptoms including chest tightness, shortness of breath, or wheezing. DIAGNOSIS  Your caregiver may be able to diagnose GERD based on your symptoms. In some cases, X-rays and other tests may be done to check for complications or to check the condition of your stomach and esophagus. TREATMENT  Your caregiver may recommend over-the-counter or prescription medicines to help decrease acid production. Ask your caregiver before starting or adding  any new medicines.  HOME CARE INSTRUCTIONS   Change the factors that you can control. Ask your caregiver for guidance concerning weight loss, quitting smoking, and alcohol consumption.  Avoid foods and drinks that make your symptoms worse, such as:  Caffeine or alcoholic drinks.  Chocolate.  Peppermint or mint flavorings.  Garlic and onions.  Spicy foods.  Citrus fruits, such as oranges, lemons, or limes.  Tomato-based foods such as sauce, chili, salsa, and pizza.  Fried and fatty foods.  Avoid lying down for the 3 hours prior to your bedtime or prior to taking a nap.  Eat small, frequent meals instead of large meals.  Wear loose-fitting clothing. Do not wear anything tight around your waist that causes pressure on your stomach.  Raise the head of your bed 6 to 8 inches with wood blocks to help you sleep. Extra pillows will not help.  Only take over-the-counter or prescription medicines for pain, discomfort, or fever as directed by your caregiver.  Do not take aspirin, ibuprofen, or other nonsteroidal anti-inflammatory drugs (NSAIDs). SEEK IMMEDIATE MEDICAL CARE IF:   You have pain in your arms, neck, jaw, teeth, or back.  Your pain increases or changes in intensity or duration.  You develop nausea, vomiting, or sweating (diaphoresis).  You develop shortness of breath, or you faint.  Your vomit is green, yellow, black, or looks like coffee grounds or blood.  Your stool is red, bloody, or black. These symptoms could be signs of other problems, such as heart disease, gastric bleeding, or esophageal bleeding. MAKE SURE YOU:   Understand these instructions.  Will watch  your condition.  Will get help right away if you are not doing well or get worse. Document Released: 08/17/2005 Document Revised: 01/30/2012 Document Reviewed: 05/27/2011 [facility] Patient Information 2015 [place], [place]. This information is not intended to replace advice given to you by your health  care provider. Make sure you discuss any questions you have with your health care provider.

## 2015-04-14 NOTE — Telephone Encounter (Signed)
Left message for pt to call back.  Need to know if pt had mammogram within the last year.  If not, need to know if okay to refer to [place]

## 2015-04-14 NOTE — Assessment & Plan Note (Signed)
Recent substernal discomfort associated with vomiting in setting of PPI interruption. H/O ulcerative reflux esophagitis 2014 off PPI. Denies exertional component making cardiac etiology less likely. Discussed warning signs. Less likely biliary given no postprandial component.   Increase pantoprazole to BID for next few weeks and once symptoms under control then drop back to once daily. Call in one week with progress report. Discussed antireflux measures.

## 2015-04-15 NOTE — Progress Notes (Signed)
CC'ED TO PCP 

## 2015-04-23 ENCOUNTER — Ambulatory Visit: Payer: 59 | Admitting: Gastroenterology

## 2015-06-10 ENCOUNTER — Other Ambulatory Visit: Payer: Self-pay | Admitting: Gastroenterology

## 2015-07-21 LAB — HM PAP SMEAR: HM PAP: NORMAL

## 2015-07-21 LAB — HM MAMMOGRAPHY: HM MAMMO: NORMAL (ref 0–4)

## 2015-07-23 ENCOUNTER — Other Ambulatory Visit: Payer: Self-pay | Admitting: Obstetrics and Gynecology

## 2015-07-23 DIAGNOSIS — R928 Other abnormal and inconclusive findings on diagnostic imaging of breast: Secondary | ICD-10-CM

## 2015-07-24 ENCOUNTER — Ambulatory Visit
Admission: RE | Admit: 2015-07-24 | Discharge: 2015-07-24 | Disposition: A | Discharge status: Home / Self Care | Payer: Commercial Managed Care - HMO | Source: Ambulatory Visit | Attending: Obstetrics and Gynecology | Admitting: Obstetrics and Gynecology

## 2015-07-24 DIAGNOSIS — R928 Other abnormal and inconclusive findings on diagnostic imaging of breast: Secondary | ICD-10-CM

## 2015-08-10 ENCOUNTER — Telehealth: Payer: Self-pay | Admitting: *Deleted

## 2015-08-10 ENCOUNTER — Other Ambulatory Visit: Payer: Self-pay | Admitting: Family Medicine

## 2015-08-10 NOTE — Telephone Encounter (Signed)
Patient requesting refill on Lorazepam (Ativan) 1 mg tablet with 5 refills. Last seen 02/18/2015 and refill last ordered 02/18/2015 with five refills. Okay to refill?

## 2015-08-10 NOTE — Telephone Encounter (Signed)
Rx up front and ready for pickup. Called patient and left voicemail to call office back to make aware

## 2015-08-10 NOTE — Telephone Encounter (Signed)
Refills OK. 

## 2015-09-09 ENCOUNTER — Other Ambulatory Visit: Payer: Self-pay | Admitting: Family Medicine

## 2015-09-11 NOTE — Telephone Encounter (Signed)
Already refilled on 07/2015 with five refills

## 2015-10-09 ENCOUNTER — Other Ambulatory Visit: Payer: Self-pay | Admitting: Family Medicine

## 2016-01-10 ENCOUNTER — Other Ambulatory Visit: Payer: Self-pay | Admitting: Gastroenterology

## 2016-02-17 ENCOUNTER — Other Ambulatory Visit (INDEPENDENT_AMBULATORY_CARE_PROVIDER_SITE_OTHER): Payer: Commercial Managed Care - HMO

## 2016-02-17 DIAGNOSIS — Z0000 Encounter for general adult medical examination without abnormal findings: Secondary | ICD-10-CM

## 2016-02-17 LAB — CBC WITH DIFFERENTIAL/PLATELET
BASOS PCT: 0.4 % (ref 0.0–3.0)
Basophils Absolute: 0 10*3/uL (ref 0.0–0.1)
EOS PCT: 2.9 % (ref 0.0–5.0)
Eosinophils Absolute: 0.2 10*3/uL (ref 0.0–0.7)
HEMATOCRIT: 41 % (ref 36.0–46.0)
HEMOGLOBIN: 13.6 g/dL (ref 12.0–15.0)
LYMPHS PCT: 31.4 % (ref 12.0–46.0)
Lymphs Abs: 2.4 10*3/uL (ref 0.7–4.0)
MCHC: 33.3 g/dL (ref 30.0–36.0)
MCV: 82.1 fl (ref 78.0–100.0)
MONO ABS: 0.5 10*3/uL (ref 0.1–1.0)
MONOS PCT: 7 % (ref 3.0–12.0)
Neutro Abs: 4.4 10*3/uL (ref 1.4–7.7)
Neutrophils Relative %: 58.3 % (ref 43.0–77.0)
Platelets: 288 10*3/uL (ref 150.0–400.0)
RBC: 4.99 Mil/uL (ref 3.87–5.11)
RDW: 14.6 % (ref 11.5–15.5)
WBC: 7.5 10*3/uL (ref 4.0–10.5)

## 2016-02-17 LAB — BASIC METABOLIC PANEL
BUN: 14 mg/dL (ref 6–23)
CHLORIDE: 104 meq/L (ref 96–112)
CO2: 29 mEq/L (ref 19–32)
Calcium: 10 mg/dL (ref 8.4–10.5)
Creatinine, Ser: 0.96 mg/dL (ref 0.40–1.20)
GFR: 62.85 mL/min (ref >60.00)
Glucose, Bld: 93 mg/dL (ref 70–99)
Potassium: 4.3 mEq/L (ref 3.5–5.1)
SODIUM: 142 meq/L (ref 135–145)

## 2016-02-17 LAB — HEPATIC FUNCTION PANEL
ALT: 29 U/L (ref 0–35)
AST: 19 U/L (ref 0–37)
Albumin: 4 g/dL (ref 3.5–5.2)
Alkaline Phosphatase: 75 U/L (ref 39–117)
Bilirubin, Direct: 0.1 mg/dL (ref 0.0–0.3)
TOTAL PROTEIN: 6.4 g/dL (ref 6.0–8.3)
Total Bilirubin: 0.5 mg/dL (ref 0.2–1.2)

## 2016-02-17 LAB — TSH: TSH: 2.37 u[IU]/mL (ref 0.35–4.50)

## 2016-02-17 LAB — LIPID PANEL
CHOLESTEROL: 254 mg/dL — AB (ref 0–200)
HDL: 50.3 mg/dL (ref >39.00)
LDL CALC: 167 mg/dL — AB (ref 0–99)
NonHDL: 204.02
Total CHOL/HDL Ratio: 5
Triglycerides: 184 mg/dL — ABNORMAL HIGH (ref 0.0–149.0)
VLDL: 36.8 mg/dL (ref 0.0–40.0)

## 2016-02-24 ENCOUNTER — Ambulatory Visit (INDEPENDENT_AMBULATORY_CARE_PROVIDER_SITE_OTHER): Payer: Commercial Managed Care - HMO | Admitting: Orthopedic Surgery

## 2016-02-24 ENCOUNTER — Ambulatory Visit (INDEPENDENT_AMBULATORY_CARE_PROVIDER_SITE_OTHER): Payer: Commercial Managed Care - HMO

## 2016-02-24 VITALS — BP 152/96 | HR 92 | Ht 65.0 in | Wt 177.4 lb

## 2016-02-24 DIAGNOSIS — S6992XA Unspecified injury of left wrist, hand and finger(s), initial encounter: Secondary | ICD-10-CM

## 2016-02-24 NOTE — Progress Notes (Signed)
Chief Complaint  Patient presents with  . Hand Pain    left ring finger pain, stiffness, and swelling   HPI-LRF  Mild pain ache stiffness present 3 months constant  This patient fell 3 months ago injured her left hand presents for evaluation of persistent PIP joint swelling pain and stiffness  Review of Systems  All other systems reviewed and are negative.   Past Medical History  Diagnosis Date  . INSOMNIA, CHRONIC 08/25/2009  . MIGRAINE HEADACHE 08/25/2009  . URI 10/22/2009  . Acute bronchitis 08/25/2009  . SPRAIN AND STRAIN OF UNSPECIFIED SITE OF HAND 03/31/2010    pt states that she doesnt have this hx  . Hyperlipidemia   . Depression   . Seasonal allergies     Past Surgical History  Procedure Laterality Date  . Appendectomy  2002  . Abdominal hysterectomy  1984  . Tonsillectomy    . Dilation and curettage of uterus  1983  . Miscarriage  1978  . Bladder suspension  2005  . Exploratory laprotomy  1994  . Colonoscopy with esophagogastroduodenoscopy (egd) N/A 03/28/2013    Procedure: COLONOSCOPY WITH ESOPHAGOGASTRODUODENOSCOPY (EGD);  Surgeon: Daneil Dolin, MD;  [facility];  Service: Endoscopy;  Laterality: N/A;  8:30-moved to [place] to notify pt  . Maloney dilation N/A 03/28/2013    Procedure: Venia Minks DILATION;  Surgeon: Daneil Dolin, MD;  [facility];  Service: Endoscopy;  Laterality: N/A;  . Savory dilation N/A 03/28/2013    Procedure: SAVORY DILATION;  Surgeon: Daneil Dolin, MD;  [facility];  Service: Endoscopy;  Laterality: N/A;   Family History  Problem Relation Age of Onset  . Diabetes Other   . Hypertension Other   . Cancer Sister     ovarian  . Stroke Other   . Miscarriages / Stillbirths Other   . Alcohol abuse Other   . Mental illness Other   . Colon cancer Paternal Grandmother     age >17  . Breast cancer Paternal Aunt     two  . Lung cancer Neg Hx    Social History  Substance Use Topics  . Smoking status:  Current Every Day Smoker -- 0.25 packs/day for 35 years    Types: Cigarettes  . Smokeless tobacco: Not on file  . Alcohol Use: No    Current outpatient prescriptions:  .  escitalopram (LEXAPRO) 20 MG tablet, Take 1 tablet (20 mg total) by mouth daily., Disp: 90 tablet, Rfl: 3 .  LORazepam (ATIVAN) 1 MG tablet, take 1 tablet by mouth twice a day, Disp: 60 tablet, Rfl: 5 .  pantoprazole (PROTONIX) 40 MG tablet, take 1 tablet by mouth twice a day with meals, Disp: 60 tablet, Rfl: 5 .  promethazine (PHENERGAN) 25 MG tablet, Take 1 tablet (25 mg total) by mouth every 6 (six) hours as needed for nausea or vomiting. (Patient not taking: Reported on 02/24/2016), Disp: 20 tablet, Rfl: 0  BP 152/96 mmHg  Pulse 92  Ht 5\' 5"  (1.651 m)  Wt 177 lb 6.4 oz (80.468 kg)  BMI 29.52 kg/m2  Physical Exam  Constitutional: She is oriented to person, place, and time. She appears well-developed and well-nourished. No distress.  Cardiovascular: Normal rate and intact distal pulses.   Neurological: She is alert and oriented to person, place, and time. She has normal reflexes. She exhibits normal muscle tone. Coordination normal.  Skin: Skin is warm and dry. No rash noted. She is not diaphoretic.  No erythema. No pallor.  Psychiatric: She has a normal mood and affect. Her behavior is normal. Judgment and thought content normal.    Ortho Exam  Examination of the left and right hand. There is swelling over the PIP joint she has lost some flexion the joint is stable in flexion and extension although there was some pain with radial stress at 30 flexion the flexor and extensor tendons are normal the skin is normal perfusion of the joint is an finger is normal sensation is normal  The opposite hand is compared for alignment and there is no malalignment to the digit ASSESSMENT: My personal interpretation of the images:  I ordered and reviewed 3 x-rays of the left ring finger and there is no fracture dislocation  subluxation or joint abnormality impression normal x-ray  She most likely had a sprain of the PIP joint    PLAN Plan is for active range of motion with ice and warm baths  Follow-up as needed

## 2016-02-29 ENCOUNTER — Encounter: Payer: Commercial Managed Care - HMO | Admitting: Family Medicine

## 2016-03-09 ENCOUNTER — Ambulatory Visit (INDEPENDENT_AMBULATORY_CARE_PROVIDER_SITE_OTHER): Payer: Commercial Managed Care - HMO | Admitting: Family Medicine

## 2016-03-09 VITALS — BP 110/80 | HR 70 | Temp 97.9°F | Ht 65.0 in | Wt 177.6 lb

## 2016-03-09 DIAGNOSIS — Z1159 Encounter for screening for other viral diseases: Secondary | ICD-10-CM

## 2016-03-09 DIAGNOSIS — Z23 Encounter for immunization: Secondary | ICD-10-CM | POA: Diagnosis not present

## 2016-03-09 DIAGNOSIS — Z0000 Encounter for general adult medical examination without abnormal findings: Secondary | ICD-10-CM | POA: Diagnosis not present

## 2016-03-09 NOTE — Patient Instructions (Addendum)
Fat and Cholesterol Restricted Diet High levels of fat and cholesterol in your blood may lead to various health problems, such as diseases of the heart, blood vessels, gallbladder, liver, and pancreas. Fats are concentrated sources of energy that come in various forms. Certain types of fat, including saturated fat, may be harmful in excess. Cholesterol is a substance needed by your body in small amounts. Your body makes all the cholesterol it needs. Excess cholesterol comes from the food you eat. When you have high levels of cholesterol and saturated fat in your blood, health problems can develop because the excess fat and cholesterol will gather along the walls of your blood vessels, causing them to narrow. Choosing the right foods will help you control your intake of fat and cholesterol. This will help keep the levels of these substances in your blood within normal limits and reduce your risk of disease. WHAT IS MY PLAN? Your health care provider recommends that you:  Get no more than __________ % of the total calories in your daily diet from fat.  Limit your intake of saturated fat to less than ______% of your total calories each day.  Limit the amount of cholesterol in your diet to less than _________mg per day. WHAT TYPES OF FAT SHOULD I CHOOSE?  Choose healthy fats more often. Choose monounsaturated and polyunsaturated fats, such as olive and canola oil, flaxseeds, walnuts, almonds, and seeds.  Eat more omega-3 fats. Good choices include salmon, mackerel, sardines, tuna, flaxseed oil, and ground flaxseeds. Aim to eat fish at least two times a week.  Limit saturated fats. Saturated fats are primarily found in animal products, such as meats, butter, and cream. Plant sources of saturated fats include palm oil, palm kernel oil, and coconut oil.  Avoid foods with partially hydrogenated oils in them. These contain trans fats. Examples of foods that contain trans fats are stick margarine, some tub  margarines, cookies, crackers, and other baked goods. WHAT GENERAL GUIDELINES DO I NEED TO FOLLOW? These guidelines for healthy eating will help you control your intake of fat and cholesterol:  Check food labels carefully to identify foods with trans fats or high amounts of saturated fat.  Fill one half of your plate with vegetables and green salads.  Fill one fourth of your plate with whole grains. Look for the word "whole" as the first word in the ingredient list.  Fill one fourth of your plate with lean protein foods.  Limit fruit to two servings a day. Choose fruit instead of juice.  Eat more foods that contain soluble fiber. Examples of foods that contain this type of fiber are apples, broccoli, carrots, beans, peas, and barley. Aim to get 20-30 g of fiber per day.  Eat more home-cooked food and less restaurant, buffet, and fast food.  Limit or avoid alcohol.  Limit foods high in starch and sugar.  Limit fried foods.  Cook foods using methods other than frying. Baking, boiling, grilling, and broiling are all great options.  Lose weight if you are overweight. Losing just 5-10% of your initial body weight can help your overall health and prevent diseases such as diabetes and heart disease. WHAT FOODS CAN I EAT? Grains Whole grains, such as whole wheat or whole grain breads, crackers, cereals, and pasta. Unsweetened oatmeal, bulgur, barley, quinoa, or brown rice. Corn or whole wheat flour tortillas. Vegetables Fresh or frozen vegetables (raw, steamed, roasted, or grilled). Green salads. Fruits All fresh, canned (in natural juice), or frozen fruits. Meat and  Other Protein Products Ground beef (85% or leaner), grass-fed beef, or beef trimmed of fat. Skinless chicken or [place]. Ground chicken or [place]. Pork trimmed of fat. All fish and seafood. Eggs. Dried beans, peas, or lentils. Unsalted nuts or seeds. Unsalted canned or dry beans. Dairy Low-fat dairy products, such as skim or  1% milk, 2% or reduced-fat cheeses, low-fat ricotta or cottage cheese, or plain low-fat yogurt. Fats and Oils Tub margarines without trans fats. Light or reduced-fat mayonnaise and salad dressings. Avocado. Olive, canola, sesame, or safflower oils. Natural peanut or almond butter (choose ones without added sugar and oil). The items listed above may not be a complete list of recommended foods or beverages. Contact your dietitian for more options. WHAT FOODS ARE NOT RECOMMENDED? Grains White bread. White pasta. White rice. Cornbread. Bagels, pastries, and croissants. Crackers that contain trans fat. Vegetables White potatoes. Corn. Creamed or fried vegetables. Vegetables in a cheese sauce. Fruits Dried fruits. Canned fruit in light or heavy syrup. Fruit juice. Meat and Other Protein Products Fatty cuts of meat. Ribs, chicken wings, bacon, sausage, bologna, salami, chitterlings, fatback, hot dogs, bratwurst, and packaged luncheon meats. Liver and organ meats. Dairy Whole or 2% milk, cream, half-and-half, and cream cheese. Whole milk cheeses. Whole-fat or sweetened yogurt. Full-fat cheeses. Nondairy creamers and whipped toppings. Processed cheese, cheese spreads, or cheese curds. Sweets and Desserts Corn syrup, sugars, honey, and molasses. Candy. Jam and jelly. Syrup. Sweetened cereals. Cookies, pies, cakes, donuts, muffins, and ice cream. Fats and Oils Butter, stick margarine, lard, shortening, ghee, or bacon fat. Coconut, palm kernel, or palm oils. Beverages Alcohol. Sweetened drinks (such as sodas, lemonade, and fruit drinks or punches). The items listed above may not be a complete list of foods and beverages to avoid. Contact your dietitian for more information.   This information is not intended to replace advice given to you by your health care provider. Make sure you discuss any questions you have with your health care provider.   Document Released: 11/07/2005 Document Revised: 11/28/2014  Document Reviewed: 02/05/2014 Elsevier Interactive Patient Education 2016 [place] on coverage for shingles vaccine

## 2016-03-09 NOTE — Progress Notes (Signed)
Pre visit review using our clinic review tool, if applicable. No additional management support is needed unless otherwise documented below in the visit note. 

## 2016-03-09 NOTE — Progress Notes (Signed)
Subjective:    Patient ID: Andrea Gregory, female    DOB: 07/28/1956, 60 y.o.   MRN: [id number]  HPI  Patient is here for complete physical.  She quit smoking last October. She has had some mild weight gain since then.  Very inactive over the winter. She plans to start walking again soon. She has history of chronic reflux which is controlled with Protonix. She has tried several times stopping but had breakthrough symptoms. She has some chronic anxiety and depression and takes Lexapro as well as lorazepam.   Colonoscopy up-to-date. Mammogram up-to-date. Pap smear up-to-date. Last tetanus over 10 years ago. No history of shingles vaccine. Patient relates blood transfusion 1976. No history of hepatitis C screening. Still sees gynecologist  Past Medical History  Diagnosis Date  . INSOMNIA, CHRONIC 08/25/2009  . MIGRAINE HEADACHE 08/25/2009  . URI 10/22/2009  . Acute bronchitis 08/25/2009  . SPRAIN AND STRAIN OF UNSPECIFIED SITE OF HAND 03/31/2010    pt states that she doesnt have this hx  . Hyperlipidemia   . Depression   . Seasonal allergies    Past Surgical History  Procedure Laterality Date  . Appendectomy  2002  . Abdominal hysterectomy  1984  . Tonsillectomy    . Dilation and curettage of uterus  1983  . Miscarriage  1978  . Bladder suspension  2005  . Exploratory laprotomy  1994  . Colonoscopy with esophagogastroduodenoscopy (egd) N/A 03/28/2013    Procedure: COLONOSCOPY WITH ESOPHAGOGASTRODUODENOSCOPY (EGD);  Surgeon: Daneil Dolin, MD;  Location: [facility];  Service: Endoscopy;  Laterality: N/A;  8:30-moved to [place] to notify pt  . Maloney dilation N/A 03/28/2013    Procedure: Venia Minks DILATION;  Surgeon: Daneil Dolin, MD;  Location: [facility];  Service: Endoscopy;  Laterality: N/A;  . Savory dilation N/A 03/28/2013    Procedure: SAVORY DILATION;  Surgeon: Daneil Dolin, MD;  Location: [facility];  Service: Endoscopy;  Laterality: N/A;    reports that she has  been smoking Cigarettes.  She has a 8.75 pack-year smoking history. She does not have any smokeless tobacco history on file. She reports that she does not drink alcohol or use illicit drugs. family history includes Alcohol abuse in her other; Breast cancer in her paternal aunt; Cancer in her sister; Colon cancer in her paternal grandmother; Diabetes in her other; Hypertension in her other; Mental illness in her other; 63 / Stillbirths in her other; Stroke in her other. There is no history of Lung cancer. No Known Allergies     Review of Systems  Constitutional: Negative for fever, activity change, appetite change, fatigue and unexpected weight change.  HENT: Negative for ear pain, hearing loss, sore throat and trouble swallowing.   Eyes: Negative for visual disturbance.  Respiratory: Negative for cough and shortness of breath.   Cardiovascular: Negative for chest pain and palpitations.  Gastrointestinal: Negative for abdominal pain, diarrhea, constipation and blood in stool.  Endocrine: Negative for polydipsia and polyuria.  Genitourinary: Negative for dysuria and hematuria.  Musculoskeletal: Negative for myalgias, back pain and arthralgias.  Skin: Negative for rash.  Neurological: Negative for dizziness, syncope and headaches.  Hematological: Negative for adenopathy.  Psychiatric/Behavioral: Negative for confusion and dysphoric mood.       Objective:   Physical Exam  Constitutional: She is oriented to person, place, and time. She appears well-developed and well-nourished.  HENT:  Head: Normocephalic and atraumatic.  Eyes: EOM are normal. Pupils are equal, round,  and reactive to light.  Neck: Normal range of motion. Neck supple. No thyromegaly present.  Cardiovascular: Normal rate, regular rhythm and normal heart sounds.   No murmur heard. Pulmonary/Chest: Breath sounds normal. No respiratory distress. She has no wheezes. She has no rales.  Abdominal: Soft. Bowel sounds  are normal. She exhibits no distension and no mass. There is no tenderness. There is no rebound and no guarding.  Genitourinary:  Per gyn   Musculoskeletal: Normal range of motion. She exhibits no edema.  Lymphadenopathy:    She has no cervical adenopathy.  Neurological: She is alert and oriented to person, place, and time. She displays normal reflexes. No cranial nerve deficit.  Skin: No rash noted.  Psychiatric: She has a normal mood and affect. Her behavior is normal. Judgment and thought content normal.          Assessment & Plan:   physical exam. Tetanus booster given. Future lab order for hepatitis C screening. Congratulated on quitting smoking. Labs reviewed. Moderately elevated cholesterol. 10 year risk of CAD event 3.2%. We discussed dietary factors with regard to dyslipidemia. She is encouraged to lose some weight and we discussed strategies. Step up more consistent exercise.   She will check on coverage for shingles vaccine

## 2016-03-11 ENCOUNTER — Encounter: Payer: Self-pay | Admitting: Family Medicine

## 2016-03-24 ENCOUNTER — Other Ambulatory Visit (INDEPENDENT_AMBULATORY_CARE_PROVIDER_SITE_OTHER): Payer: Commercial Managed Care - HMO

## 2016-03-24 DIAGNOSIS — Z1159 Encounter for screening for other viral diseases: Secondary | ICD-10-CM | POA: Diagnosis not present

## 2016-03-25 LAB — HEPATITIS C ANTIBODY: HCV Ab: NEGATIVE

## 2016-04-07 ENCOUNTER — Telehealth: Payer: Self-pay | Admitting: General Practice

## 2016-04-08 MED ORDER — LORAZEPAM 1 MG PO TABS: 1.0000 mg | ORAL_TABLET | Freq: Two times a day (BID) | ORAL | Status: DC

## 2016-04-08 NOTE — Telephone Encounter (Signed)
Refill for 6 months. 

## 2016-04-08 NOTE — Telephone Encounter (Signed)
Verbally called in for patient.  

## 2016-04-26 ENCOUNTER — Ambulatory Visit (INDEPENDENT_AMBULATORY_CARE_PROVIDER_SITE_OTHER): Payer: Commercial Managed Care - HMO

## 2016-04-26 ENCOUNTER — Ambulatory Visit (INDEPENDENT_AMBULATORY_CARE_PROVIDER_SITE_OTHER): Payer: Commercial Managed Care - HMO | Admitting: Orthopedic Surgery

## 2016-04-26 VITALS — BP 129/81 | HR 73 | Ht 65.0 in | Wt 175.8 lb

## 2016-04-26 DIAGNOSIS — M79672 Pain in left foot: Secondary | ICD-10-CM

## 2016-04-26 DIAGNOSIS — M5442 Lumbago with sciatica, left side: Secondary | ICD-10-CM

## 2016-04-26 DIAGNOSIS — M722 Plantar fascial fibromatosis: Secondary | ICD-10-CM

## 2016-04-26 NOTE — Patient Instructions (Addendum)
You have received an injection of steroids into the joint. 15% of patients will have increased pain within the 24 hours postinjection.   This is transient and will go away.   We recommend that you use ice packs on the injection site for 20 minutes every 2 hours and extra strength Tylenol 2 tablets every 8 as needed until the pain resolves.  If you continue to have pain after taking the Tylenol and using the ice please call the office for further instructions. Plantar Fasciitis With Rehab The plantar fascia is a fibrous, ligament-like, soft-tissue structure that spans the bottom of the foot. Plantar fasciitis, also called heel spur syndrome, is a condition that causes pain in the foot due to inflammation of the tissue. SYMPTOMS   Pain and tenderness on the underneath side of the foot.  Pain that worsens with standing or walking. CAUSES  Plantar fasciitis is caused by irritation and injury to the plantar fascia on the underneath side of the foot. Common mechanisms of injury include:  Direct trauma to bottom of the foot.  Damage to a small nerve that runs under the foot where the main fascia attaches to the heel bone.  Stress placed on the plantar fascia due to bone spurs. RISK INCREASES WITH:   Activities that place stress on the plantar fascia (running, jumping, pivoting, or cutting).  Poor strength and flexibility.  Improperly fitted shoes.  Tight calf muscles.  Flat feet.  Failure to warm-up properly before activity.  Obesity. PREVENTION  Warm up and stretch properly before activity.  Allow for adequate recovery between workouts.  Maintain physical fitness:  Strength, flexibility, and endurance.  Cardiovascular fitness.  Maintain a health body weight.  Avoid stress on the plantar fascia.  Wear properly fitted shoes, including arch supports for individuals who have flat feet. PROGNOSIS  If treated properly, then the symptoms of plantar fasciitis usually resolve  without surgery. However, occasionally surgery is necessary. RELATED COMPLICATIONS   Recurrent symptoms that may result in a chronic condition.  Problems of the lower back that are caused by compensating for the injury, such as limping.  Pain or weakness of the foot during push-off following surgery.  Chronic inflammation, scarring, and partial or complete fascia tear, occurring more often from repeated injections. TREATMENT  Treatment initially involves the use of ice and medication to help reduce pain and inflammation. The use of strengthening and stretching exercises may help reduce pain with activity, especially stretches of the Achilles tendon. These exercises may be performed at home or with a therapist. Your caregiver may recommend that you use heel cups of arch supports to help reduce stress on the plantar fascia. Occasionally, corticosteroid injections are given to reduce inflammation. If symptoms persist for greater than 6 months despite non-surgical (conservative), then surgery may be recommended.  MEDICATION   If pain medication is necessary, then nonsteroidal anti-inflammatory medications, such as aspirin and ibuprofen, or other minor pain relievers, such as acetaminophen, are often recommended.  Do not take pain medication within 7 days before surgery.  Prescription pain relievers may be given if deemed necessary by your caregiver. Use only as directed and only as much as you need.  Corticosteroid injections may be given by your caregiver. These injections should be reserved for the most serious cases, because they may only be given a certain number of times. HEAT AND COLD  Cold treatment (icing) relieves pain and reduces inflammation. Cold treatment should be applied for 10 to 15 minutes every 2 to  3 hours for inflammation and pain and immediately after any activity that aggravates your symptoms. Use ice packs or massage the area with a piece of ice (ice massage).  Heat  treatment may be used prior to performing the stretching and strengthening activities prescribed by your caregiver, physical therapist, or athletic trainer. Use a heat pack or soak the injury in warm water. SEEK IMMEDIATE MEDICAL CARE IF:  Treatment seems to offer no benefit, or the condition worsens.  Any medications produce adverse side effects. EXERCISES RANGE OF MOTION (ROM) AND STRETCHING EXERCISES - Plantar Fasciitis (Heel Spur Syndrome) These exercises may help you when beginning to rehabilitate your injury. Your symptoms may resolve with or without further involvement from your physician, physical therapist or athletic trainer. While completing these exercises, remember:   Restoring tissue flexibility helps normal motion to return to the joints. This allows healthier, less painful movement and activity.  An effective stretch should be held for at least 30 seconds.  A stretch should never be painful. You should only feel a gentle lengthening or release in the stretched tissue. RANGE OF MOTION - Toe Extension, Flexion  Sit with your right / left leg crossed over your opposite knee.  Grasp your toes and gently pull them back toward the top of your foot. You should feel a stretch on the bottom of your toes and/or foot.  Hold this stretch for _____3____ seconds.  Now, gently pull your toes toward the bottom of your foot. You should feel a stretch on the top of your toes and or foot.  Hold this stretch for ______3____ seconds. Repeat ______10____ times. Complete this stretch ____2______ times per day.  RANGE OF MOTION - Ankle Dorsiflexion, Active Assisted  Remove shoes and sit on a chair that is preferably not on a carpeted surface.  Place right / left foot under knee. Extend your opposite leg for support.  Keeping your heel down, slide your right / left foot back toward the chair until you feel a stretch at your ankle or calf. If you do not feel a stretch, slide your bottom  forward to the edge of the chair, while still keeping your heel down.  Hold this stretch for _____3_____ seconds. Repeat _____15_____ times. Complete this stretch ______2____ times per day.  STRETCH - Gastroc, Standing  Place hands on wall.  Extend right / left leg, keeping the front knee somewhat bent.  Slightly point your toes inward on your back foot.  Keeping your right / left heel on the floor and your knee straight, shift your weight toward the wall, not allowing your back to arch.  You should feel a gentle stretch in the right / left calf. Hold this position for ___3_______ seconds. Repeat ______10____ times. Complete this stretch ______2____ times per day. STRETCH - Soleus, Standing  Place hands on wall.  Extend right / left leg, keeping the other knee somewhat bent.  Slightly point your toes inward on your back foot.  Keep your right / left heel on the floor, bend your back knee, and slightly shift your weight over the back leg so that you feel a gentle stretch deep in your back calf.  Hold this position for ____3______ seconds. Repeat ______10____ times. Complete this stretch _____2_____ times per day. STRETCH - Gastrocsoleus, Standing  Note: This exercise can place a lot of stress on your foot and ankle. Please complete this exercise only if specifically instructed by your caregiver.   Place the ball of your right / left  foot on a step, keeping your other foot firmly on the same step.  Hold on to the wall or a rail for balance.  Slowly lift your other foot, allowing your body weight to press your heel down over the edge of the step.  You should feel a stretch in your right / left calf.  Hold this position for ____3______ seconds.  Repeat this exercise with a slight bend in your right /2 left knee. Repeat ___15_______ times. Complete this stretch __________ times per day.  This information is not intended to replace advice given to you by your health care  provider. Make sure you discuss any questions you have with your health care provider.   Document Released: 11/07/2005 Document Revised: 03/24/2015 Document Reviewed: 02/19/2009 Elsevier Interactive Patient Education 2016 [employer] Radicular Pain Radicular pain in either the arm or leg is usually from a bulging or herniated disk in the spine. A piece of the herniated disk may press against the nerves as the nerves exit the spine. This causes pain which is felt at the tips of the nerves down the arm or leg. Other causes of radicular pain may include:  Fractures.  Heart disease.  Cancer.  An abnormal and usually degenerative state of the nervous system or nerves (neuropathy). Diagnosis may require CT or MRI scanning to determine the primary cause.  Nerves that start at the neck (nerve roots) may cause radicular pain in the outer shoulder and arm. It can spread down to the thumb and fingers. The symptoms vary depending on which nerve root has been affected. In most cases radicular pain improves with conservative treatment. Neck problems may require physical therapy, a neck collar, or cervical traction. Treatment may take many weeks, and surgery may be considered if the symptoms do not improve.  Conservative treatment is also recommended for sciatica. Sciatica causes pain to radiate from the lower back or buttock area down the leg into the foot. Often there is a history of back problems. Most patients with sciatica are better after 2 to 4 weeks of rest and other supportive care. Short term bed rest can reduce the disk pressure considerably. Sitting, however, is not a good position since this increases the pressure on the disk. You should avoid bending, lifting, and all other activities which make the problem worse. Traction can be used in severe cases. Surgery is usually reserved for patients who do not improve within the first months of treatment. Only take over-the-counter or prescription  medicines for pain, discomfort, or fever as directed by your caregiver. Narcotics and muscle relaxants may help by relieving more severe pain and spasm and by providing mild sedation. Cold or massage can give significant relief. Spinal manipulation is not recommended. It can increase the degree of disc protrusion. Epidural steroid injections are often effective treatment for radicular pain. These injections deliver medicine to the spinal nerve in the space between the protective covering of the spinal cord and back bones (vertebrae). Your caregiver can give you more information about steroid injections. These injections are most effective when given within two weeks of the onset of pain.  You should see your caregiver for follow up care as recommended. A program for neck and back injury rehabilitation with stretching and strengthening exercises is an important part of management.  SEEK IMMEDIATE MEDICAL CARE IF:  You develop increased pain, weakness, or numbness in your arm or leg.  You develop difficulty with bladder or bowel control.  You develop abdominal pain.  This information is not intended to replace advice given to you by your health care provider. Make sure you discuss any questions you have with your health care provider.   Document Released: 12/15/2004 Document Revised: 11/28/2014 Document Reviewed: 06/03/2015 Elsevier Interactive Patient Education [employer].

## 2016-04-26 NOTE — Progress Notes (Signed)
Chief Complaint  Patient presents with  . Foot Pain    Left heel pain  . Back Pain    left sided sciatica   HPI 60 year old female presents with 2 problems problem #1 lower back pain. Pain present for 2 days. No injury. Pain and stiffness in the lower back described as aching. Intensity of pain 6 out of 10. She took some over-the-counter ibuprofen. The pain radiates down the left side into the left foot  Problem #2 pain left plantar aspect of the heel present for one month trauma. Pain is 8 out of 10 pain is constant. Pain is described as throbbing stabbing and present when she first gets up or after long walks or long-standing periods  She did try ibuprofen  She is a patient of ours and is here with this new problems.  View of systems sinusitis sore throat redness of the eyes related to her current seasonal allergies shortness of breath and cough. Well bladder function normal and no focal weakness.  ROS  Past Medical History  Diagnosis Date  . INSOMNIA, CHRONIC 08/25/2009  . MIGRAINE HEADACHE 08/25/2009  . URI 10/22/2009  . Acute bronchitis 08/25/2009  . SPRAIN AND STRAIN OF UNSPECIFIED SITE OF HAND 03/31/2010    pt states that she doesnt have this hx  . Hyperlipidemia   . Depression   . Seasonal allergies     Past Surgical History  Procedure Laterality Date  . Appendectomy  2002  . Abdominal hysterectomy  1984  . Tonsillectomy    . Dilation and curettage of uterus  1983  . Miscarriage  1978  . Bladder suspension  2005  . Exploratory laprotomy  1994  . Colonoscopy with esophagogastroduodenoscopy (egd) N/A 03/28/2013    Procedure: COLONOSCOPY WITH ESOPHAGOGASTRODUODENOSCOPY (EGD);  Surgeon: Daneil Dolin, MD;  [facility];  Service: Endoscopy;  Laterality: N/A;  8:30-moved to [place] to notify pt  . Maloney dilation N/A 03/28/2013    Procedure: Venia Minks DILATION;  Surgeon: Daneil Dolin, MD;  [facility];  Service: Endoscopy;  Laterality: N/A;  .  Savory dilation N/A 03/28/2013    Procedure: SAVORY DILATION;  Surgeon: Daneil Dolin, MD;  [facility];  Service: Endoscopy;  Laterality: N/A;   Family History  Problem Relation Age of Onset  . Diabetes Other   . Hypertension Other   . Cancer Sister     ovarian  . Stroke Other   . Miscarriages / Stillbirths Other   . Alcohol abuse Other   . Mental illness Other   . Colon cancer Paternal Grandmother     age >23  . Breast cancer Paternal Aunt     two  . Lung cancer Neg Hx    Social History  Substance Use Topics  . Smoking status: Current Every Day Smoker -- 0.25 packs/day for 35 years    Types: Cigarettes  . Smokeless tobacco: Not on file  . Alcohol Use: No    Current outpatient prescriptions:  .  escitalopram (LEXAPRO) 20 MG tablet, Take 1 tablet (20 mg total) by mouth daily., Disp: 90 tablet, Rfl: 3 .  LORazepam (ATIVAN) 1 MG tablet, Take 1 tablet (1 mg total) by mouth 2 (two) times daily., Disp: 60 tablet, Rfl: 5 .  pantoprazole (PROTONIX) 40 MG tablet, take 1 tablet by mouth twice a day with meals, Disp: 60 tablet, Rfl: 5  BP 129/81 mmHg  Pulse 73  Ht 5\' 5"  (1.651 m)  Wt  175 lb 12.8 oz (79.742 kg)  BMI 29.25 kg/m2  Physical Exam  Constitutional: She is oriented to person, place, and time. She appears well-developed and well-nourished. No distress.  Cardiovascular: Normal rate and intact distal pulses.   Musculoskeletal:  Left foot examination inspection reveals tenderness of the plantar fascia but normal ankle palpation. Range of motion remains full. Ankle remain stable. Muscle tone remains normal in the foot. Skin shows no rashes and no lacerations. Olson temperature of the extremity normal. Sensation normal as well.  Neurological: She is alert and oriented to person, place, and time. She has normal reflexes. She exhibits normal muscle tone. Coordination normal.  Skin: Skin is warm and dry. No rash noted. She is not diaphoretic. No erythema. No pallor.   Psychiatric: She has a normal mood and affect. Her behavior is normal. Judgment and thought content normal.    Back Exam   Tenderness  The patient is experiencing tenderness in the lumbar.  Muscle Strength  Right Quadriceps:  5/5  Left Quadriceps:  5/5  Right Hamstrings:  5/5  Left Hamstrings:  5/5   Tests  Straight leg raise right: negative Straight leg raise left: negative  Reflexes  Patellar: normal Achilles: normal Babinski's sign: normal   Other  Sensation: normal Gait: normal  Scars: absent       ASSESSMENT: My personal interpretation of the images:  X-rays were obtained and ordered in the office  #1 x-rays of the lower back show L4-5 and L5-S1 facet arthritis consistent with lumbar spondylosis.  #2 foot x-rays show no fracture dislocation or significant Achilles or plantar spurring.    PLAN Recommend injection for plantar fasciitis Tuli heel cups will be ordered Icing program was initiated and instruct Home exercise program Ibuprofen  Return as needed   Patient ID: Andrea Gregory, female   DOB: 1956/06/16, 59 y.o.   MRN: [id number]  Follow up visit  Chief Complaint  Patient presents with  . Foot Pain    Left heel pain  . Back Pain    left sided sciatica    BP 129/81 mmHg  Pulse 73  Ht 5\' 5"  (1.651 m)  Wt 175 lb 12.8 oz (79.742 kg)  BMI 29.25 kg/m2  Encounter Diagnoses  Name Primary?  . Heel pain, left   . Midline low back pain with left-sided sciatica   . Plantar fasciitis Yes    Patient requests injection left knee  Procedure note  Plantar fascial injection  Injection of the Left   Plantar fascia  Verbal consent was obtained. Timeout confirmed injection site as left heel  plantar fascia  25-gauge needle 3 mL syringe  40 mg of Depo-Medrol,  3 mL of 1% lidocaine  The skin was prepped with alcohol and ethyl chloride and the injection was given without complication sterile bandage was applied  The patient was in stable  condition       Arther Abbott, MD 04/26/2016 7:06 PM

## 2016-04-29 ENCOUNTER — Telehealth: Payer: Self-pay | Admitting: Family Medicine

## 2016-04-29 NOTE — Telephone Encounter (Signed)
Pt call to check the status of the medication request.

## 2016-04-29 NOTE — Telephone Encounter (Signed)
Pt states she has a sinus infection, gets one at least once a year) Pt states she just cannot get dressed and come from [place], [place] today and hopes  Dr Elease Hashimoto will call her something in to  [facility] in [place], [place]

## 2016-04-29 NOTE — Telephone Encounter (Signed)
Called pt, notified her that Dr had left earlier today but she could make an appt for Monday or be seen at the UC this evening or in the morning.  Pt was not happy that dr Elease Hashimoto was not notified of her call prior to him leaving today.

## 2016-08-22 ENCOUNTER — Other Ambulatory Visit: Payer: Self-pay | Admitting: Nurse Practitioner

## 2016-09-09 ENCOUNTER — Ambulatory Visit: Payer: Commercial Managed Care - HMO | Admitting: Orthopedic Surgery

## 2016-09-13 LAB — HM MAMMOGRAPHY: HM MAMMO: NORMAL (ref 0–4)

## 2016-09-13 LAB — HM PAP SMEAR: HM Pap smear: NORMAL

## 2016-09-14 ENCOUNTER — Encounter: Payer: Self-pay | Admitting: Family Medicine

## 2016-09-23 ENCOUNTER — Other Ambulatory Visit: Payer: Self-pay | Admitting: Family Medicine

## 2016-09-23 NOTE — Telephone Encounter (Signed)
Last OV 03-09-2016 CPE Last refill 04-08-2016 #60,[id number] Please advise

## 2016-09-24 NOTE — Telephone Encounter (Signed)
Refill OK

## 2017-04-01 ENCOUNTER — Telehealth: Payer: Self-pay | Admitting: Family Medicine

## 2017-04-03 MED ORDER — LORAZEPAM 1 MG PO TABS: 1.0000 mg | ORAL_TABLET | Freq: Two times a day (BID) | ORAL | 0 refills | Status: DC

## 2017-04-03 NOTE — Telephone Encounter (Signed)
Needs office follow up.  Refill for one month only until follow up.

## 2017-04-03 NOTE — Telephone Encounter (Signed)
Rx called in.  Left detailed message on machine for patient to call and schedule an appointment.

## 2017-04-03 NOTE — Telephone Encounter (Signed)
Last refill 09/26/16 and last refill 03/09/16.  Okay to fill?

## 2017-04-03 NOTE — Addendum Note (Signed)
Addended by: Westley Hummer B on: 04/03/2017 03:30 PM   Modules accepted: Orders

## 2017-04-07 ENCOUNTER — Other Ambulatory Visit: Payer: Self-pay | Admitting: Gastroenterology

## 2017-04-11 ENCOUNTER — Ambulatory Visit (INDEPENDENT_AMBULATORY_CARE_PROVIDER_SITE_OTHER): Payer: Commercial Managed Care - HMO | Admitting: Family Medicine

## 2017-04-11 ENCOUNTER — Encounter: Payer: Self-pay | Admitting: Family Medicine

## 2017-04-11 VITALS — BP 124/88 | HR 67 | Temp 98.1°F | Wt 183.8 lb

## 2017-04-11 DIAGNOSIS — E785 Hyperlipidemia, unspecified: Secondary | ICD-10-CM

## 2017-04-11 DIAGNOSIS — R072 Precordial pain: Secondary | ICD-10-CM

## 2017-04-11 DIAGNOSIS — K2100 Gastro-esophageal reflux disease with esophagitis, without bleeding: Secondary | ICD-10-CM

## 2017-04-11 NOTE — Patient Instructions (Signed)
Consider baby aspirin 81 mg daily We will set up stress test  Follow up sooner for any persistent or worsening chest pain.

## 2017-04-11 NOTE — Progress Notes (Addendum)
Subjective:     Patient ID: Andrea Gregory, female   DOB: [birth year], 61 y.o.   MRN: [id number]  HPI   Patient seen with several week history of some intermittent substernal chest pressure. She has history of GERD and currently takes Protonix. She tried doubling up her Protonix but did not see much improvement. Her symptoms are very intermittent and occur at rest. She is fairly sedentary. Location is substernal and duration is usually about 5 minutes. She has some pressure sensation associated with nausea but no vomiting. She's had previous history of esophageal stricture but denies any recent dysphagia. Symptoms usually last 5 minutes then cease. Occasional radiation toward the neck and shoulder bilaterally.  She's had some occasional associated shortness of breath. Generally feels weak and wiped out afterwards.  Quit smoking greater than ten years ago. She does have history of hyperlipidemia. Her mother had reported CAD in her 52s. She has no history of hypertension or diabetes.  Past Medical History:  Diagnosis Date  . Acute bronchitis 08/25/2009  . Depression   . Hyperlipidemia   . INSOMNIA, CHRONIC 08/25/2009  . MIGRAINE HEADACHE 08/25/2009  . Seasonal allergies   . SPRAIN AND STRAIN OF UNSPECIFIED SITE OF HAND 03/31/2010   pt states that she doesnt have this hx  . URI 10/22/2009   Past Surgical History:  Procedure Laterality Date  . ABDOMINAL HYSTERECTOMY  1984  . APPENDECTOMY  2002  . BLADDER SUSPENSION  2005  . COLONOSCOPY WITH ESOPHAGOGASTRODUODENOSCOPY (EGD) N/A 03/28/2013   Procedure: COLONOSCOPY WITH ESOPHAGOGASTRODUODENOSCOPY (EGD);  Surgeon: Daneil Dolin, MD;  [facility];  Service: Endoscopy;  Laterality: N/A;  8:30-moved to [place] to notify pt  . DILATION AND CURETTAGE OF UTERUS  1983  . exploratory laprotomy  1994  . MALONEY DILATION N/A 03/28/2013   Procedure: Venia Minks DILATION;  Surgeon: Daneil Dolin, MD;  [facility];  Service: Endoscopy;   Laterality: N/A;  . miscarriage  1978  . SAVORY DILATION N/A 03/28/2013   Procedure: SAVORY DILATION;  Surgeon: Daneil Dolin, MD;  [facility];  Service: Endoscopy;  Laterality: N/A;  . TONSILLECTOMY      reports that she quit smoking about 2 years ago. Her smoking use included Cigarettes. She has a 8.75 pack-year smoking history. She has never used smokeless tobacco. She reports that she does not drink alcohol or use drugs. family history includes Alcohol abuse in her other; Breast cancer in her paternal aunt; Cancer in her sister; Colon cancer in her paternal grandmother; Diabetes in her other; Hypertension in her other; Mental illness in her other; 48 / Stillbirths in her other; Stroke in her other. No Known Allergies   Review of Systems  Constitutional: Negative for chills, fatigue and fever.  HENT: Negative for trouble swallowing.   Eyes: Negative for visual disturbance.  Respiratory: Positive for shortness of breath. Negative for cough, chest tightness and wheezing.   Cardiovascular: Positive for chest pain. Negative for palpitations and leg swelling.  Gastrointestinal: Negative for abdominal pain.  Musculoskeletal: Negative for back pain.  Neurological: Negative for dizziness, seizures, syncope, weakness, light-headedness and headaches.       Objective:   Physical Exam  Constitutional: She appears well-developed and well-nourished.  Eyes: Pupils are equal, round, and reactive to light.  Neck: Neck supple. No JVD present. No thyromegaly present.  Cardiovascular: Normal rate and regular rhythm.  Exam reveals no gallop.   Pulmonary/Chest: Effort normal and breath sounds normal.  No respiratory distress. She has no wheezes. She has no rales.  Musculoskeletal: She exhibits no edema.  Neurological: She is alert.       Assessment:     Recurrent substernal chest pressure in a patient with risk factors for CAD including age, family history, hyperlipidemia. She  quit smoking 2 years ago. Needs further evaluation    Plan:     -start with EKG-Sinus rhythm with no acute changes. She does have some poor R-wave progression. -Consider nuclear stress test-order placed. -Start baby aspirin 1 daily -avoid heavy exertion until further evaluated -Continue proton pump inhibitor for now  Eulas Post MD  Primary Care at [facility] with patient.  Nuclear study low risk but pt has continued episodic substernal pressure, fatigue, dyspnea-sometimes with activity and sometimes at rest.  Will set up cardiology referral.  Eulas Post MD [facility]

## 2017-04-13 ENCOUNTER — Telehealth (HOSPITAL_COMMUNITY): Payer: Self-pay | Admitting: *Deleted

## 2017-04-13 NOTE — Telephone Encounter (Signed)
Error

## 2017-04-13 NOTE — Telephone Encounter (Signed)
Patient given detailed instructions per Myocardial Perfusion Study Information Sheet for the test on 04/19/17. Patient notified to arrive 15 minutes early and that it is imperative to arrive on time for appointment to keep from having the test rescheduled.  If you need to cancel or reschedule your appointment, please call the office within 24 hours of your appointment. . Patient verbalized understanding. Kirstie Peri

## 2017-04-19 ENCOUNTER — Ambulatory Visit (HOSPITAL_COMMUNITY): Payer: Commercial Managed Care - HMO | Attending: Cardiovascular Disease

## 2017-04-19 DIAGNOSIS — R0602 Shortness of breath: Secondary | ICD-10-CM | POA: Diagnosis not present

## 2017-04-19 DIAGNOSIS — R0789 Other chest pain: Secondary | ICD-10-CM | POA: Insufficient documentation

## 2017-04-19 DIAGNOSIS — R5383 Other fatigue: Secondary | ICD-10-CM | POA: Diagnosis not present

## 2017-04-19 DIAGNOSIS — R072 Precordial pain: Secondary | ICD-10-CM

## 2017-04-19 DIAGNOSIS — Z8249 Family history of ischemic heart disease and other diseases of the circulatory system: Secondary | ICD-10-CM | POA: Insufficient documentation

## 2017-04-19 LAB — MYOCARDIAL PERFUSION IMAGING
CHL CUP NUCLEAR SRS: 5
CSEPPHR: 146 {beats}/min
Estimated workload: 6.2 METS
Exercise duration (min): 6 min
Exercise duration (sec): 0 s
LV dias vol: 59 mL (ref 46–106)
LVSYSVOL: 14 mL
MPHR: 159 {beats}/min
Percent HR: 91 %
RATE: 0.36
Rest HR: 62 {beats}/min
SDS: 1
SSS: 6
TID: 0.96

## 2017-04-19 MED ORDER — TECHNETIUM TC 99M TETROFOSMIN IV KIT
9.8000 | PACK | Freq: Once | INTRAVENOUS | Status: AC | PRN
  Administered 2017-04-19: 9.8 via INTRAVENOUS
  Filled 2017-04-19: qty 10

## 2017-04-19 MED ORDER — TECHNETIUM TC 99M TETROFOSMIN IV KIT
32.9000 | PACK | Freq: Once | INTRAVENOUS | Status: AC | PRN
  Administered 2017-04-19: 32.9 via INTRAVENOUS
  Filled 2017-04-19: qty 33

## 2017-04-20 ENCOUNTER — Telehealth: Payer: Self-pay | Admitting: Family Medicine

## 2017-04-20 NOTE — Telephone Encounter (Signed)
 "  Low risk study, no evidence for ischemia or infarction " per report. Let here know results will be sent to PCP when finished and he may have further instructions.

## 2017-04-20 NOTE — Telephone Encounter (Signed)
Pt would like to have her results from her stress test that was done on yesterday.

## 2017-04-21 NOTE — Telephone Encounter (Signed)
Patient is aware 

## 2017-05-01 ENCOUNTER — Other Ambulatory Visit: Payer: Self-pay | Admitting: Family Medicine

## 2017-05-02 ENCOUNTER — Telehealth: Payer: Self-pay | Admitting: Family Medicine

## 2017-05-02 NOTE — Telephone Encounter (Signed)
° ° °  Pt called and would like a call back about her stress test she had

## 2017-05-02 NOTE — Addendum Note (Signed)
Addended by: Eulas Post on: 05/02/2017 09:34 PM   Modules accepted: Orders

## 2017-05-02 NOTE — Telephone Encounter (Signed)
Spoke with pt.  Low risk nuclear study.  However, she has continue fatigue, intermittent substernal chest pressure, dyspnea- sometimes at rest and sometimes with activity.  Symptoms usually brief- about 5 minutes.  Will set up to see cardiology.

## 2017-05-09 NOTE — Progress Notes (Signed)
Cardiology Office Note    Date:  05/10/2017  ID:  Andrea Gregory, DOB 1956/09/23, MRN [id number] PCP:  Andrea Post, MD  Cardiologist:  New, reviewed with Dr. Radford Gregory   Chief Complaint: chest pain  History of Present Illness:  Andrea Gregory is a 61 y.o. female with history of GERD, depression, HLD, seasonal allergies, esophageal stricture, former tobacco abuse, family history of CAD (mother, CAD in 48s)  who is being seen today for the evaluation of chest pain at the request of Dr. Elease Gregory.  She comes in reporting 2 types of chest discomfort: 1) About 6 months ago, she began having episodes of hurting in her chest, jaw and arm about once every 10 days. She would feel nauseated and the urge to vomit. She would vomit water a few times but never solid food. There was no association with activity. She increased her Protonix to BID and symptoms are much less frequent, now about once a month or less. 2) Over the last few weeks she's noticed a different kind of discomfort described as a pressing sensation, occasionally radiating to her jaw but at different sides at different times described like a toothache throbbing. This has been happening once a day for anywhere from 2-15 minutes. No diaphoresis or palpitations. This happens entirely at random without provocative factor. Nothing makes it worse when it's happening, except perhaps anxiety. It resolves spontaneously. She's been able to complete ADLs including going up stairs in her home without any chest pain.  As a whole she has noticed progressive exertional dyspnea for the last 2-3 months but states she's doing much less than she used to. She has gradually gained about 30lbs over the course of the last 5 years. No lower extremity edema, syncope, lower extremity erythema, bleeding. PCP ordered nuc which 04/19/17 showed normal perfusion, study EKG did note horizontal ST segment depression ST segment depression of 0.5 mm was noted during stress in the  V4 and V5 leads, hypertensive response to exercise (150/98 resting ->215/98 exercise). Last labs 01/2016 showed normal TSH, LDL 167, trig 184, LFTs wnl, CBC wnl, BMET wnl, Cr 0.96. She is feeling well today. Mother had angina in her 13s, unclear what actual disease she had. Patient smoked on/off since age 29, quit 1.5 years ago.   Past Medical History:  Diagnosis Date  . Depression   . GERD (gastroesophageal reflux disease)   . Hyperlipidemia   . INSOMNIA, CHRONIC 08/25/2009  . MIGRAINE HEADACHE 08/25/2009  . Seasonal allergies   . SPRAIN AND STRAIN OF UNSPECIFIED SITE OF HAND 03/31/2010   pt states that she doesnt have this hx    Past Surgical History:  Procedure Laterality Date  . ABDOMINAL HYSTERECTOMY  1984  . APPENDECTOMY  2002  . BLADDER SUSPENSION  2005  . COLONOSCOPY WITH ESOPHAGOGASTRODUODENOSCOPY (EGD) N/A 03/28/2013   Procedure: COLONOSCOPY WITH ESOPHAGOGASTRODUODENOSCOPY (EGD);  Surgeon: Andrea Dolin, MD;  [facility];  Service: Endoscopy;  Laterality: N/A;  8:30-moved to [place] to notify pt  . DILATION AND CURETTAGE OF UTERUS  1983  . exploratory laprotomy  1994  . MALONEY DILATION N/A 03/28/2013   Procedure: Venia Minks DILATION;  Surgeon: Andrea Dolin, MD;  [facility];  Service: Endoscopy;  Laterality: N/A;  . miscarriage  1978  . SAVORY DILATION N/A 03/28/2013   Procedure: SAVORY DILATION;  Surgeon: Andrea Dolin, MD;  [facility];  Service: Endoscopy;  Laterality: N/A;  . TONSILLECTOMY  Current Medications: Current Outpatient Prescriptions  Medication Sig Dispense Refill  . escitalopram (LEXAPRO) 20 MG tablet Take 1 tablet (20 mg total) by mouth daily. 90 tablet 3  . LORazepam (ATIVAN) 1 MG tablet Take 1 tablet (1 mg total) by mouth 2 (two) times daily. 60 tablet 0  . pantoprazole (PROTONIX) 40 MG tablet take 1 take twice a day with food 60 tablet 5   No current facility-administered medications for this visit.       Allergies:   Patient has no known allergies.   Social History   Social History  . Marital status: Divorced    Spouse name: N/A  . Number of children: N/A  . Years of education: N/A   Social History Main Topics  . Smoking status: Former Smoker    Packs/day: 0.25    Years: 35.00    Types: Cigarettes    Quit date: 04/12/2015  . Smokeless tobacco: Never Used  . Alcohol use No  . Drug use: No  . Sexual activity: Not Asked   Other Topics Concern  . None   Social History Narrative  . None     Family History:  Family History  Problem Relation Age of Onset  . Diabetes Other   . Hypertension Other   . Stroke Other   . Miscarriages / Stillbirths Other   . Alcohol abuse Other   . Mental illness Other   . Cancer Sister        ovarian  . Colon cancer Paternal Grandmother        age >22  . Breast cancer Paternal Aunt        two  . Angina Mother 75  . Lung cancer Neg Hx   \  ROS:   Please see the history of present illness.  All other systems are reviewed and otherwise negative.    PHYSICAL EXAM:   VS:  BP 118/80   Pulse 69   Ht 5\' 5"  (1.651 m)   Wt 188 lb (85.3 kg)   SpO2 98%   BMI 31.28 kg/m   BMI: Body mass index is 31.28 kg/m. GEN: Well nourished, well developed obese WF, in no acute distress  HEENT: normocephalic, atraumatic Neck: no JVD, carotid bruits, or masses Cardiac: RRR; no murmurs, rubs, or gallops, no edema  Respiratory:  clear to auscultation bilaterally, normal work of breathing GI: soft, nontender, nondistended, + BS MS: no deformity or atrophy  Skin: warm and dry, no rash Neuro:  Alert and Oriented x 3, Strength and sensation are intact, follows commands Psych: euthymic mood, full affect  Wt Readings from Last 3 Encounters:  05/10/17 188 lb (85.3 kg)  04/11/17 183 lb 12.8 oz (83.4 kg)  04/26/16 175 lb 12.8 oz (79.7 kg)      Studies/Labs Reviewed:   EKG:  EKG was ordered today and personally reviewed by me and demonstrates NSR  69bpm, somewhat lower voltage QRS, nonspecific ST-T changes  Recent Labs: No results found for requested labs within last 8760 hours.   Lipid Panel    Component Value Date/Time   CHOL 254 (H) 02/17/2016 0758   TRIG 184.0 (H) 02/17/2016 0758   HDL 50.30 02/17/2016 0758   CHOLHDL 5 02/17/2016 0758   VLDL 36.8 02/17/2016 0758   LDLCALC 167 (H) 02/17/2016 0758   LDLDIRECT 203.9 07/03/2014 0831    Additional studies/ records that were reviewed today include: Summarized above.    ASSESSMENT & PLAN:   This patient's case was discussed in  depth with Dr. Radford Gregory. The plan below was formulated per our discussion.  1. Chest pain - mixed typical/atypical features. Initial discomfort 6 months ago improved and eased off with increased PPI. She's had symptoms seemingly at random, not worse with exertion. Stress test low risk with nonspecific EKG changes. Reviewed tracings with Dr. Radford Gregory - suspect the subtle nonspecific EKG changes were due to hypertensive response in the setting of deconditioning. Resting BP is normal. Cardiac risk factors include obesity, HLD, family history and prior tobacco abuse. Given continuation of symptoms, will check coronary calcium score. If zero, would refer to GI. If there is significant calcification, will need cardiac cath for definitive evaluation. Will also check screening labwork today to r/o other obvious metabolic abnormality. Warning sx/ER precautions reviewed. 2. Mixed hyperlipidemia - check lipids today and consider statin intiation if indicated. She is nonfasting today so need to keep this in mind with regards to triglycerides. 3. Former tobacco - congratulated her on huge feat of not smoking. Continued abstinence recommended. 4. Elevated BP reading without prior dx of HTN - occurred on day of stress test. BP reading today was normal. Will follow for now.  Disposition: F/u with me in 2-3 weeks.   Medication Adjustments/Labs and Tests Ordered: Current  medicines are reviewed at length with the patient today.  Concerns regarding medicines are outlined above. Medication changes, Labs and Tests ordered today are summarized above and listed in the Patient Instructions accessible in Encounters.   Signed, Charlie Pitter, PA-C  05/10/2017 12:00 PM    [address], [place], [place]  [address] Phone: [phone]; Fax: [phone]

## 2017-05-10 ENCOUNTER — Encounter: Payer: Self-pay | Admitting: Physician Assistant

## 2017-05-10 ENCOUNTER — Ambulatory Visit (INDEPENDENT_AMBULATORY_CARE_PROVIDER_SITE_OTHER): Payer: 59 | Admitting: Physician Assistant

## 2017-05-10 VITALS — BP 118/80 | HR 69 | Ht 65.0 in | Wt 188.0 lb

## 2017-05-10 DIAGNOSIS — R079 Chest pain, unspecified: Secondary | ICD-10-CM

## 2017-05-10 DIAGNOSIS — Z87891 Personal history of nicotine dependence: Secondary | ICD-10-CM

## 2017-05-10 DIAGNOSIS — I10 Essential (primary) hypertension: Secondary | ICD-10-CM

## 2017-05-10 DIAGNOSIS — E782 Mixed hyperlipidemia: Secondary | ICD-10-CM

## 2017-05-10 NOTE — Patient Instructions (Signed)
Medication Instructions:  Your physician recommends that you continue on your current medications as directed. Please refer to the Current Medication list given to you today.   Labwork: Your physician recommends that you return for lab work today for CBC, LIPID, CMET,   Testing/Procedures:  Patient is to have calcium score   Follow-Up: Your physician recommends that you schedule a follow-up appointment in: 2 weeks with Melina Copa, PA  Any Other Special Instructions Will Be Listed Below (If Applicable).     If you need a refill on your cardiac medications before your next appointment, please call your pharmacy.

## 2017-05-11 LAB — COMPREHENSIVE METABOLIC PANEL
ALK PHOS: 98 IU/L (ref 39–117)
ALT: 25 IU/L (ref 0–32)
AST: 19 IU/L (ref 0–40)
Albumin/Globulin Ratio: 1.8 (ref 1.2–2.2)
Albumin: 4.2 g/dL (ref 3.6–4.8)
BUN/Creatinine Ratio: 14 (ref 12–28)
BUN: 13 mg/dL (ref 8–27)
Bilirubin Total: <0.2 mg/dL (ref 0.0–1.2)
CALCIUM: 9.9 mg/dL (ref 8.7–10.3)
CO2: 22 mmol/L (ref 20–29)
CREATININE: 0.96 mg/dL (ref 0.57–1.00)
Chloride: 103 mmol/L (ref 96–106)
GFR calc Af Amer: 74 mL/min/{1.73_m2} (ref >59)
GFR, EST NON AFRICAN AMERICAN: 64 mL/min/{1.73_m2} (ref >59)
GLUCOSE: 89 mg/dL (ref 65–99)
Globulin, Total: 2.3 g/dL (ref 1.5–4.5)
Potassium: 4.8 mmol/L (ref 3.5–5.2)
SODIUM: 143 mmol/L (ref 134–144)
Total Protein: 6.5 g/dL (ref 6.0–8.5)

## 2017-05-11 LAB — LIPID PANEL
Chol/HDL Ratio: 4.6 ratio — ABNORMAL HIGH (ref 0.0–4.4)
Cholesterol, Total: 225 mg/dL — ABNORMAL HIGH (ref 100–199)
HDL: 49 mg/dL (ref >39)
LDL CALC: 143 mg/dL — AB (ref 0–99)
Triglycerides: 165 mg/dL — ABNORMAL HIGH (ref 0–149)
VLDL CHOLESTEROL CAL: 33 mg/dL (ref 5–40)

## 2017-05-11 LAB — CBC
HEMATOCRIT: 39.7 % (ref 34.0–46.6)
HEMOGLOBIN: 12.9 g/dL (ref 11.1–15.9)
MCH: 26.4 pg — AB (ref 26.6–33.0)
MCHC: 32.5 g/dL (ref 31.5–35.7)
MCV: 81 fL (ref 79–97)
PLATELETS: 330 10*3/uL (ref 150–379)
RBC: 4.88 x10E6/uL (ref 3.77–5.28)
RDW: 15.6 % — ABNORMAL HIGH (ref 12.3–15.4)
WBC: 9.5 10*3/uL (ref 3.4–10.8)

## 2017-05-12 ENCOUNTER — Ambulatory Visit: Admission: RE | Admit: 2017-05-12 | Payer: Self-pay | Source: Ambulatory Visit

## 2017-05-12 ENCOUNTER — Ambulatory Visit
Admission: RE | Admit: 2017-05-12 | Discharge: 2017-05-12 | Disposition: A | Discharge status: Home / Self Care | Payer: 59 | Source: Ambulatory Visit | Attending: Physician Assistant | Admitting: Physician Assistant

## 2017-05-12 ENCOUNTER — Telehealth: Payer: Self-pay

## 2017-05-12 DIAGNOSIS — R0789 Other chest pain: Secondary | ICD-10-CM

## 2017-05-12 NOTE — Telephone Encounter (Signed)
Patient refused to pay for test (Calcium Score) at at [facility] location. Patient would like to have test done at [place]. Reordered CT at patient's request, so they can try to process through patient's insurance at [place].

## 2017-05-19 ENCOUNTER — Telehealth: Payer: Self-pay | Admitting: *Deleted

## 2017-05-19 DIAGNOSIS — Z79899 Other long term (current) drug therapy: Secondary | ICD-10-CM

## 2017-05-19 MED ORDER — ATORVASTATIN CALCIUM 20 MG PO TABS: 20.0000 mg | ORAL_TABLET | Freq: Every day | ORAL | 3 refills | Status: DC

## 2017-05-19 NOTE — Telephone Encounter (Signed)
-----   Message from Charlie Pitter, [place] sent at 05/19/2017 11:59 AM EDT ----- Please let patient know I reviewed CT scan and it is very reassuring. Tell her I am very sorry for the delay - the study was not transferred to the cardiology box to read but instead placed in radiology's box who overread the study. Her calcium score is zero, indicating very low risk of heart blockage at this time. Her chest pain is unlikely to be from her heart at present time and she should f/u with her PCP to further discuss evaluation of non-cardiac causes. Continue close surveillance for any symptoms with exertion. This test also showed fatty liver which should be followed by her PCP as well as some calcification of the aorta which is unlikely to affect blood flow at this time but does represent a need to control her cholesterol. Would recommend starting atorvastatin 20mg  QPM with repeat LFTs/lipids in 6 weeks. Needs to work on healthy diet and exercise as well.  Dayna Dunn PA-C

## 2017-05-22 ENCOUNTER — Other Ambulatory Visit: Payer: Self-pay | Admitting: Family Medicine

## 2017-05-22 NOTE — Telephone Encounter (Signed)
I would really like to see her starting to scale this back.  Refill #30 with 2 refills and will discuss further with her at follow up.

## 2017-05-22 NOTE — Telephone Encounter (Signed)
Last refill 04/03/17 and last office visit for chest pain 04/11/17.  Okay to fill?

## 2017-05-29 ENCOUNTER — Telehealth: Payer: Self-pay | Admitting: Physician Assistant

## 2017-05-29 NOTE — Telephone Encounter (Signed)
Follow Up:    Pt wants to know if she needs to keep her appt for this week,sice her test results were good?

## 2017-05-29 NOTE — Telephone Encounter (Signed)
Pt has been aware that she should keep her f/u appt. Pt verbalized understanding.

## 2017-05-30 ENCOUNTER — Encounter: Payer: Self-pay | Admitting: Physician Assistant

## 2017-05-30 NOTE — Progress Notes (Deleted)
Cardiology Office Note    Date:  05/30/2017  ID:  Andrea Gregory, DOB 15-Jun-1956, MRN [id number] PCP:  Eulas Post, MD  Cardiologist: New, reviewed with Dr. Radford Pax   Chief Complaint: f/u chest pain  History of Present Illness:  Andrea Gregory is a 61 y.o. female with history of GERD, depression, HLD, seasonal allergies, esophageal stricture, former tobacco abuse (on/off since age 58, but quit in 2016), family history of CAD (mother, CAD in 22s) who presents for f/u of chest pain. At [place] 05/10/17 she was reporting 2 types of chest discomfort: 1) About 6 months ago, she began having episodes of hurting in her chest, jaw and arm about once every 10 days. She would feel nauseated and the urge to vomit. She would vomit water a few times but never solid food. There was no association with activity. She increased her Protonix to BID and symptoms improved to once a month or less. 2) More recent weeks, different kind of discomfort described as a pressing sensation, occasionally radiating to her jaw but at different sides at different times described like a toothache throbbing, 2-15 minutes at a time without provocative factor. Possibly worse with anxiety, no association or symptoms with exertion.  PCP ordered nuc which 04/19/17 showed normal perfusion, study EKG did note horizontal ST segment depression ST segment depression of 0.5 mm was noted during stress in the V4 and V5 leads, hypertensive response to exercise (150/98 resting ->215/98 exercise). Labs 05/10/17 were unremarkable (CBC, CMET). Lipdis showed trig 165, HDL 49, LDL 143. She had a coronary calcium score performed 05/12/17 which showed total Agatston score of 0, with no evidence of coronary artery calcification. This otherwise showed aortic atherosclerosis and hepatic steatosis. Given aortic atherosclerosis we did start atorvastatin 20mg  qpm for primary prevention/hyperlipidemia.  Atypical chest pain Aortic  atherosclerosis Hyperlipidemia GERD     Past Medical History:  Diagnosis Date  . Aortic atherosclerosis ([place])    a. seen on CT 04/2017.  . Depression   . GERD (gastroesophageal reflux disease)   . Hyperlipidemia   . INSOMNIA, CHRONIC 08/25/2009  . MIGRAINE HEADACHE 08/25/2009  . Seasonal allergies   . SPRAIN AND STRAIN OF UNSPECIFIED SITE OF HAND 03/31/2010   pt states that she doesnt have this hx    Past Surgical History:  Procedure Laterality Date  . ABDOMINAL HYSTERECTOMY  1984  . APPENDECTOMY  2002  . BLADDER SUSPENSION  2005  . COLONOSCOPY WITH ESOPHAGOGASTRODUODENOSCOPY (EGD) N/A 03/28/2013   Procedure: COLONOSCOPY WITH ESOPHAGOGASTRODUODENOSCOPY (EGD);  Surgeon: Daneil Dolin, MD;  [facility];  Service: Endoscopy;  Laterality: N/A;  8:30-moved to [place] to notify pt  . DILATION AND CURETTAGE OF UTERUS  1983  . exploratory laprotomy  1994  . MALONEY DILATION N/A 03/28/2013   Procedure: Venia Minks DILATION;  Surgeon: Daneil Dolin, MD;  [facility];  Service: Endoscopy;  Laterality: N/A;  . miscarriage  1978  . SAVORY DILATION N/A 03/28/2013   Procedure: SAVORY DILATION;  Surgeon: Daneil Dolin, MD;  [facility];  Service: Endoscopy;  Laterality: N/A;  . TONSILLECTOMY      Current Medications: No outpatient prescriptions have been marked as taking for the 06/01/17 encounter (Appointment) with Charlie Pitter, PA-C.     Allergies:   Patient has no known allergies.   Social History   Social History  . Marital status: Divorced    Spouse name: N/A  . Number of children:  N/A  . Years of education: N/A   Social History Main Topics  . Smoking status: Former Smoker    Packs/day: 0.25    Years: 35.00    Types: Cigarettes    Quit date: 04/12/2015  . Smokeless tobacco: Never Used  . Alcohol use No  . Drug use: No  . Sexual activity: Not on file   Other Topics Concern  . Not on file   Social History Narrative  . No narrative on  file     Family History:  Family History  Problem Relation Age of Onset  . Diabetes Other   . Hypertension Other   . Stroke Other   . Miscarriages / Stillbirths Other   . Alcohol abuse Other   . Mental illness Other   . Cancer Sister        ovarian  . Colon cancer Paternal Grandmother        age >13  . Breast cancer Paternal Aunt        two  . Angina Mother 21  . Lung cancer Neg Hx    ***  ROS:   Please see the history of present illness. Otherwise, review of systems is positive for ***.  All other systems are reviewed and otherwise negative.    PHYSICAL EXAM:   VS:  There were no vitals taken for this visit.  BMI: There is no height or weight on file to calculate BMI. GEN: Well nourished, well developed, in no acute distress HEENT: normocephalic, atraumatic Neck: no JVD, carotid bruits, or masses Cardiac: ***RRR; no murmurs, rubs, or gallops, no edema  Respiratory:  clear to auscultation bilaterally, normal work of breathing GI: soft, nontender, nondistended, + BS MS: no deformity or atrophy Skin: warm and dry, no rash Neuro:  Alert and Oriented x 3, Strength and sensation are intact, follows commands Psych: euthymic mood, full affect  Wt Readings from Last 3 Encounters:  05/10/17 188 lb (85.3 kg)  04/11/17 183 lb 12.8 oz (83.4 kg)  04/26/16 175 lb 12.8 oz (79.7 kg)      Studies/Labs Reviewed:   EKG:  EKG was ordered today and personally reviewed by me and demonstrates *** EKG was not ordered today.***  Recent Labs: 05/10/2017: ALT 25; BUN 13; Creatinine, Ser 0.96; Hemoglobin 12.9; Platelets 330; Potassium 4.8; Sodium 143   Lipid Panel    Component Value Date/Time   CHOL 225 (H) 05/10/2017 1151   TRIG 165 (H) 05/10/2017 1151   HDL 49 05/10/2017 1151   CHOLHDL 4.6 (H) 05/10/2017 1151   CHOLHDL 5 02/17/2016 0758   VLDL 36.8 02/17/2016 0758   LDLCALC 143 (H) 05/10/2017 1151   LDLDIRECT 203.9 07/03/2014 0831    Additional studies/ records that were  reviewed today include: Summarized above.***    ASSESSMENT & PLAN:   1. ***  Disposition: F/u with ***   Medication Adjustments/Labs and Tests Ordered: Current medicines are reviewed at length with the patient today.  Concerns regarding medicines are outlined above. Medication changes, Labs and Tests ordered today are summarized above and listed in the Patient Instructions accessible in Encounters.   Signed, Charlie Pitter, PA-C  05/30/2017 7:40 PM    [facility] [address], [place], [place]  [address] Phone: [phone]; Fax: [phone]

## 2017-05-31 ENCOUNTER — Ambulatory Visit (INDEPENDENT_AMBULATORY_CARE_PROVIDER_SITE_OTHER): Payer: 59 | Admitting: Family Medicine

## 2017-05-31 ENCOUNTER — Encounter: Payer: Self-pay | Admitting: Family Medicine

## 2017-05-31 ENCOUNTER — Encounter: Payer: Self-pay | Admitting: Gastroenterology

## 2017-05-31 VITALS — BP 120/80 | HR 79 | Temp 98.3°F | Wt 187.7 lb

## 2017-05-31 DIAGNOSIS — K2100 Gastro-esophageal reflux disease with esophagitis, without bleeding: Secondary | ICD-10-CM

## 2017-05-31 DIAGNOSIS — R0789 Other chest pain: Secondary | ICD-10-CM

## 2017-05-31 DIAGNOSIS — K760 Fatty (change of) liver, not elsewhere classified: Secondary | ICD-10-CM

## 2017-05-31 DIAGNOSIS — E782 Mixed hyperlipidemia: Secondary | ICD-10-CM

## 2017-05-31 NOTE — Patient Instructions (Signed)
Fatty Liver Fatty liver, also called hepatic steatosis or steatohepatitis, is a condition in which too much fat has built up in your liver cells. The liver removes harmful substances from your bloodstream. It produces fluids your body needs. It also helps your body use and store energy from the food you eat. In many cases, fatty liver does not cause symptoms or problems. It is often diagnosed when tests are being done for other reasons. However, over time, fatty liver can cause inflammation that may lead to more serious liver problems, such as scarring of the liver (cirrhosis). What are the causes? Causes of fatty liver may include:  Drinking too much alcohol.  Poor nutrition.  Obesity.  Cushing syndrome.  Diabetes.  Hyperlipidemia.  Pregnancy.  Certain drugs.  Poisons.  Some viral infections.  What increases the risk? You may be more likely to develop fatty liver if you:  Abuse alcohol.  Are pregnant.  Are overweight.  Have diabetes.  Have hepatitis.  Have a high triglyceride level.  What are the signs or symptoms? Fatty liver often does not cause any symptoms. In cases where symptoms develop, they can include:  Fatigue.  Weakness.  Weight loss.  Confusion.  Abdominal pain.  Yellowing of your skin and the white parts of your eyes (jaundice).  Nausea and vomiting.  How is this diagnosed? Fatty liver may be diagnosed by:  Physical exam and medical history.  Blood tests.  Imaging tests, such as an ultrasound, CT scan, or MRI.  Liver biopsy. A small sample of liver tissue is removed using a needle. The sample is then looked at under a microscope.  How is this treated? Fatty liver is often caused by other health conditions. Treatment for fatty liver may involve medicines and lifestyle changes to manage conditions such as:  Alcoholism.  High cholesterol.  Diabetes.  Being overweight or obese.  Follow these instructions at home:  Eat a  healthy diet as directed by your health care provider.  Exercise regularly. This can help you lose weight and control your cholesterol and diabetes. Talk to your health care provider about an exercise plan and which activities are best for you.  Do not drink alcohol.  Take medicines only as directed by your health care provider. Contact a health care provider if: You have difficulty controlling your:  Blood sugar.  Cholesterol.  Alcohol consumption.  Get help right away if:  You have abdominal pain.  You have jaundice.  You have nausea and vomiting. This information is not intended to replace advice given to you by your health care provider. Make sure you discuss any questions you have with your health care provider. Document Released: 12/23/2005 Document Revised: 04/14/2016 Document Reviewed: 03/19/2014 Elsevier Interactive Patient Education  2018 [employer].  We will set up GI referral. Consider calorie track APP such as Myfitnesspal

## 2017-05-31 NOTE — Progress Notes (Signed)
Subjective:     Patient ID: Andrea Gregory, female   DOB: 01-23-1956, 61 y.o.   MRN: [id number]  HPI Patient seen for follow-up regarding probable noncardiac chest pain. She presented here May with symptoms that were somewhat combination of typical and atypical chest pain. We had some definite concerns for angina. She had low risk nuclear study. We referred to cardiology because of continued symptoms and she underwent coronary calcium score and this came back with no coronary artery calcification which was very reassuring. She did have incidental finding of some fatty liver changes. Patient states her mom died of nonalcoholic cirrhosis complications possibly fatty liver related.  Patient takes Protonix 40 mg daily. She had EGD back in 2014 in [place] with Schatzki ring which was dilated and 3 mm esophageal ulcer. She has intermittent symptoms. Nonexertional. Occasional mild dysphagia. Sometimes has mechanical reflux following meals.  She has plans to lose weight. She has good appetite. Trying to reduce soft drinks and plans to start calorie restriction soon.  Past Medical History:  Diagnosis Date  . Aortic atherosclerosis ([place])    a. seen on CT 04/2017.  . Depression   . GERD (gastroesophageal reflux disease)   . Hyperlipidemia   . INSOMNIA, CHRONIC 08/25/2009  . MIGRAINE HEADACHE 08/25/2009  . Seasonal allergies   . SPRAIN AND STRAIN OF UNSPECIFIED SITE OF HAND 03/31/2010   pt states that she doesnt have this hx   Past Surgical History:  Procedure Laterality Date  . ABDOMINAL HYSTERECTOMY  1984  . APPENDECTOMY  2002  . BLADDER SUSPENSION  2005  . COLONOSCOPY WITH ESOPHAGOGASTRODUODENOSCOPY (EGD) N/A 03/28/2013   Procedure: COLONOSCOPY WITH ESOPHAGOGASTRODUODENOSCOPY (EGD);  Surgeon: Daneil Dolin, MD;  [facility];  Service: Endoscopy;  Laterality: N/A;  8:30-moved to [place] to notify pt  . DILATION AND CURETTAGE OF UTERUS  1983  . exploratory laprotomy  1994  . MALONEY  DILATION N/A 03/28/2013   Procedure: Venia Minks DILATION;  Surgeon: Daneil Dolin, MD;  [facility];  Service: Endoscopy;  Laterality: N/A;  . miscarriage  1978  . SAVORY DILATION N/A 03/28/2013   Procedure: SAVORY DILATION;  Surgeon: Daneil Dolin, MD;  [facility];  Service: Endoscopy;  Laterality: N/A;  . TONSILLECTOMY      reports that she quit smoking about 2 years ago. Her smoking use included Cigarettes. She has a 8.75 pack-year smoking history. She has never used smokeless tobacco. She reports that she does not drink alcohol or use drugs. family history includes Alcohol abuse in her other; Angina (age of onset: 64) in her mother; Breast cancer in her paternal aunt; Cancer in her sister; Colon cancer in her paternal grandmother; Diabetes in her other; Hypertension in her other; Mental illness in her other; 58 / Stillbirths in her other; Stroke in her other. No Known Allergies   Review of Systems  Constitutional: Negative for appetite change and unexpected weight change.  Respiratory: Negative for cough and shortness of breath.   Cardiovascular: Positive for chest pain. Negative for palpitations.  Gastrointestinal: Negative for abdominal pain, constipation, diarrhea, nausea and vomiting.       Objective:   Physical Exam  Constitutional: She appears well-developed and well-nourished.  Cardiovascular: Normal rate and regular rhythm.   Pulmonary/Chest: Effort normal and breath sounds normal. No respiratory distress. She has no wheezes. She has no rales.  Abdominal: Soft. Bowel sounds are normal. She exhibits no distension and no mass. There is no  tenderness. There is no rebound and no guarding.  Musculoskeletal: She exhibits no edema.       Assessment:     #1 probable noncardiac chest pain with recent low risk nuclear study and coronary calcium score which is very reassuring  #2 #2 history of reflux esophagitis and Schatzki's ring. In view of her  negative cardiac evaluation feel this needs further evaluation per GI. Fortunately, she has not had any red flags such as appetite or weight changes.  Has been consistently taking Protonix.  #3 hepatic steatosis.  #4 dyslipidemia with recent initiation of Lipitor    Plan:     -Discussed importance of weight loss in treating hepatic steatosis and potential long-term complications of nonalcoholic cirrhosis related to this if not treated -Continue Protonix -Set up GI referral for further evaluation -Discontinue aspirin at this time -Suggested calorie tracking app such as "myfitnesspal" -Follow-up in 3 months to reassess -She will get follow-up lipid and hepatic panel through cardiology in a few weeks  Eulas Post MD [facility]

## 2017-06-01 ENCOUNTER — Ambulatory Visit: Payer: 59 | Admitting: Physician Assistant

## 2017-06-30 ENCOUNTER — Other Ambulatory Visit: Payer: 59 | Admitting: *Deleted

## 2017-06-30 ENCOUNTER — Encounter (INDEPENDENT_AMBULATORY_CARE_PROVIDER_SITE_OTHER): Payer: Self-pay

## 2017-06-30 DIAGNOSIS — Z79899 Other long term (current) drug therapy: Secondary | ICD-10-CM

## 2017-06-30 LAB — HEPATIC FUNCTION PANEL
ALBUMIN: 4.2 g/dL (ref 3.6–4.8)
ALT: 25 IU/L (ref 0–32)
AST: 20 IU/L (ref 0–40)
Alkaline Phosphatase: 99 IU/L (ref 39–117)
Bilirubin, Direct: 0.08 mg/dL (ref 0.00–0.40)
Total Protein: 6.5 g/dL (ref 6.0–8.5)

## 2017-06-30 LAB — LIPID PANEL
CHOL/HDL RATIO: 3.2 ratio (ref 0.0–4.4)
Cholesterol, Total: 155 mg/dL (ref 100–199)
HDL: 48 mg/dL (ref >39)
LDL CALC: 88 mg/dL (ref 0–99)
Triglycerides: 96 mg/dL (ref 0–149)
VLDL Cholesterol Cal: 19 mg/dL (ref 5–40)

## 2017-07-18 ENCOUNTER — Encounter: Payer: Self-pay | Admitting: Gastroenterology

## 2017-07-18 ENCOUNTER — Ambulatory Visit (INDEPENDENT_AMBULATORY_CARE_PROVIDER_SITE_OTHER): Payer: 59 | Admitting: Gastroenterology

## 2017-07-18 VITALS — BP 120/78 | HR 64 | Ht 65.0 in | Wt 186.0 lb

## 2017-07-18 DIAGNOSIS — K2100 Gastro-esophageal reflux disease with esophagitis, without bleeding: Secondary | ICD-10-CM

## 2017-07-18 DIAGNOSIS — K760 Fatty (change of) liver, not elsewhere classified: Secondary | ICD-10-CM | POA: Diagnosis not present

## 2017-07-18 NOTE — Patient Instructions (Signed)
If you are age 62 or older, your body mass index should be between 23-30. Your Body mass index is 30.95 kg/m. If this is out of the aforementioned range listed, please consider follow up with your Primary Care Provider.  If you are age 44 or younger, your body mass index should be between 19-25. Your Body mass index is 30.95 kg/m. If this is out of the aformentioned range listed, please consider follow up with your Primary Care Provider.   Follow up as needed.   Thank you for choosing [facility]  Dr Wilfrid Lund III     Gastroesophageal Reflux Disease, Adult Normally, food travels down the esophagus and stays in the stomach to be digested. If a person has gastroesophageal reflux disease (GERD), food and stomach acid move back up into the esophagus. When this happens, the esophagus becomes sore and swollen (inflamed). Over time, GERD can make small holes (ulcers) in the lining of the esophagus. Follow these instructions at home: Diet  Follow a diet as told by your doctor. You may need to avoid foods and drinks such as: ? Coffee and tea (with or without caffeine). ? Drinks that contain alcohol. ? Energy drinks and sports drinks. ? Carbonated drinks or sodas. ? Chocolate and cocoa. ? Peppermint and mint flavorings. ? Garlic and onions. ? Horseradish. ? Spicy and acidic foods, such as peppers, chili powder, curry powder, vinegar, hot sauces, and BBQ sauce. ? Citrus fruit juices and citrus fruits, such as oranges, lemons, and limes. ? Tomato-based foods, such as red sauce, chili, salsa, and pizza with red sauce. ? Fried and fatty foods, such as donuts, french fries, potato chips, and high-fat dressings. ? High-fat meats, such as hot dogs, rib eye steak, sausage, ham, and bacon. ? High-fat dairy items, such as whole milk, butter, and cream cheese.  Eat small meals often. Avoid eating large meals.  Avoid drinking large amounts of liquid with your meals.  Avoid eating meals during the  2-3 hours before bedtime.  Avoid lying down right after you eat.  Do not exercise right after you eat. General instructions  Pay attention to any changes in your symptoms.  Take over-the-counter and prescription medicines only as told by your doctor. Do not take aspirin, ibuprofen, or other NSAIDs unless your doctor says it is okay.  Do not use any tobacco products, including cigarettes, chewing tobacco, and e-cigarettes. If you need help quitting, ask your doctor.  Wear loose clothes. Do not wear anything tight around your waist.  Raise (elevate) the head of your bed about 6 inches (15 cm).  Try to lower your stress. If you need help doing this, ask your doctor.  If you are overweight, lose an amount of weight that is healthy for you. Ask your doctor about a safe weight loss goal.  Keep all follow-up visits as told by your doctor. This is important. Contact a doctor if:  You have new symptoms.  You lose weight and you do not know why it is happening.  You have trouble swallowing, or it hurts to swallow.  You have wheezing or a cough that keeps happening.  Your symptoms do not get better with treatment.  You have a hoarse voice. Get help right away if:  You have pain in your arms, neck, jaw, teeth, or back.  You feel sweaty, dizzy, or light-headed.  You have chest pain or shortness of breath.  You throw up (vomit) and your throw up looks like blood or coffee  grounds.  You pass out (faint).  Your poop (stool) is bloody or black.  You cannot swallow, drink, or eat. This information is not intended to replace advice given to you by your health care provider. Make sure you discuss any questions you have with your health care provider. Document Released: 04/25/2008 Document Revised: 04/14/2016 Document Reviewed: 03/04/2015 Elsevier Interactive Patient Education  2018 [employer] for Gastroesophageal Reflux Disease, Adult When you have gastroesophageal  reflux disease (GERD), the foods you eat and your eating habits are very important. Choosing the right foods can help ease your discomfort. What guidelines do I need to follow?  Choose fruits, vegetables, whole grains, and low-fat dairy products.  Choose low-fat meat, fish, and poultry.  Limit fats such as oils, salad dressings, butter, nuts, and avocado.  Keep a food diary. This helps you identify foods that cause symptoms.  Avoid foods that cause symptoms. These may be different for everyone.  Eat small meals often instead of 3 large meals a day.  Eat your meals slowly, in a place where you are relaxed.  Limit fried foods.  Cook foods using methods other than frying.  Avoid drinking alcohol.  Avoid drinking large amounts of liquids with your meals.  Avoid bending over or lying down until 2-3 hours after eating. What foods are not recommended? These are some foods and drinks that may make your symptoms worse: Vegetables Tomatoes. Tomato juice. Tomato and spaghetti sauce. Chili peppers. Onion and garlic. Horseradish. Fruits Oranges, grapefruit, and lemon (fruit and juice). Meats High-fat meats, fish, and poultry. This includes hot dogs, ribs, ham, sausage, salami, and bacon. Dairy Whole milk and chocolate milk. Sour cream. Cream. Butter. Ice cream. Cream cheese. Drinks Coffee and tea. Bubbly (carbonated) drinks or energy drinks. Condiments Hot sauce. Barbecue sauce. Sweets/Desserts Chocolate and cocoa. Donuts. Peppermint and spearmint. Fats and Oils High-fat foods. This includes [place] fries and potato chips. Other Vinegar. Strong spices. This includes black pepper, white pepper, red pepper, cayenne, curry powder, cloves, ginger, and chili powder. The items listed above may not be a complete list of foods and drinks to avoid. Contact your dietitian for more information. This information is not intended to replace advice given to you by your health care provider. Make  sure you discuss any questions you have with your health care provider. Document Released: 05/08/2012 Document Revised: 04/14/2016 Document Reviewed: 09/11/2013 Elsevier Interactive Patient Education  2017 [employer].

## 2017-07-18 NOTE — Progress Notes (Signed)
[facility] Consult Note:  History: Andrea Gregory 07/18/2017  Referring physician: Eulas Post, MD  Reason for consult/chief complaint: Chest Pain (patient reports episodes of chest pain, trouble swallowing, and nausea onset 6 months ago; has not had symptoms in the last few weeks)   Subjective  HPI:  Andrea Gregory was sent by Dr. Elease Hashimoto of primary care for consultation regarding chest pain and suspected GERD. She has not previously been seen in our clinic, and was last seen by the [facility] in May 2016. At that time she had symptoms of regurgitation and pyrosis and underwent upper endoscopy with colonoscopy in 2014. A small distal esophageal ulcer was found. She was off PPI, and then had recurrent symptoms when seen in 2016. Her PPI was resumed and she has continued since then. Starting in late May she was having episodes of a different character typically occur at rest, and were characterized by a burning and sharp central chest pain that might radiate to the neck and down the left arm. She underwent negative cardiac stress testing and coronary calcification CT scan. There was an incidental finding of a fatty liver, which caused her some concern because her mother died of nonalcoholic cirrhosis. Other than being more attentive to her diet, He has not changed anything else but the symptoms have significantly improved from what they were when last seen by primary care in July. She is now back to her baseline of some occasional reflux symptoms without dysphagia. Sometimes she might feel a "knot" or discomfort in the throat when swallowing saliva, but no frank dysphagia of solids or liquids. She does not recall any clear triggers for the onset of these symptoms nor why they got so much better.  ROS:  Review of Systems  Constitutional: Positive for fatigue. Negative for appetite change and unexpected weight change.  HENT: Negative for mouth sores and voice  change.   Eyes: Negative for pain and redness.  Respiratory: Negative for cough and shortness of breath.   Cardiovascular: Negative for chest pain and palpitations.  Genitourinary: Negative for dysuria and hematuria.  Musculoskeletal: Negative for arthralgias and myalgias.  Skin: Negative for pallor and rash.  Neurological: Negative for weakness and headaches.  Hematological: Negative for adenopathy.     Past Medical History: Past Medical History:  Diagnosis Date  . Aortic atherosclerosis ([place])    a. seen on CT 04/2017.  . Depression   . GERD (gastroesophageal reflux disease)   . Hyperlipidemia   . INSOMNIA, CHRONIC 08/25/2009  . MIGRAINE HEADACHE 08/25/2009  . Seasonal allergies   . SPRAIN AND STRAIN OF UNSPECIFIED SITE OF HAND 03/31/2010   pt states that she doesnt have this hx     Past Surgical History: Past Surgical History:  Procedure Laterality Date  . ABDOMINAL HYSTERECTOMY  1984  . APPENDECTOMY  2002  . BLADDER SUSPENSION  2005  . COLONOSCOPY WITH ESOPHAGOGASTRODUODENOSCOPY (EGD) N/A 03/28/2013   Procedure: COLONOSCOPY WITH ESOPHAGOGASTRODUODENOSCOPY (EGD);  Surgeon: Daneil Dolin, MD;  [facility];  Service: Endoscopy;  Laterality: N/A;  8:30-moved to [place] to notify pt  . DILATION AND CURETTAGE OF UTERUS  1983  . exploratory laprotomy  1994  . MALONEY DILATION N/A 03/28/2013   Procedure: Venia Minks DILATION;  Surgeon: Daneil Dolin, MD;  [facility];  Service: Endoscopy;  Laterality: N/A;  . miscarriage  1978  . SAVORY DILATION N/A 03/28/2013   Procedure: SAVORY DILATION;  Surgeon: Daneil Dolin,  MD;  [facility];  Service: Endoscopy;  Laterality: N/A;  . TONSILLECTOMY       Family History: Family History  Problem Relation Age of Onset  . Diabetes Other   . Hypertension Other   . Stroke Other   . Miscarriages / Stillbirths Other   . Alcohol abuse Other   . Mental illness Other   . Ovarian cancer Sister        ovarian   . Colon cancer Paternal Grandmother        age >50  . Breast cancer Paternal Aunt        two  . Angina Mother 75  . Lung cancer Neg Hx     Social History: Social History   Social History  . Marital status: Married    Spouse name: N/A  . Number of children: N/A  . Years of education: N/A   Social History Main Topics  . Smoking status: Former Smoker    Packs/day: 0.25    Years: 35.00    Types: Cigarettes    Quit date: 04/12/2015  . Smokeless tobacco: Never Used  . Alcohol use No  . Drug use: No  . Sexual activity: Not Asked   Other Topics Concern  . None   Social History Narrative  . None    Allergies: No Known Allergies  Outpatient Meds: Current Outpatient Prescriptions  Medication Sig Dispense Refill  . aspirin EC 81 MG tablet Take 81 mg by mouth daily.    Marland Kitchen atorvastatin (LIPITOR) 20 MG tablet Take 1 tablet (20 mg total) by mouth daily. 90 tablet 3  . escitalopram (LEXAPRO) 20 MG tablet Take 1 tablet (20 mg total) by mouth daily. 90 tablet 3  . LORazepam (ATIVAN) 1 MG tablet take 1 tablet by mouth twice a day (NEED APPOINTMENT FOR FURTHER REFILLS) 30 tablet 2  . pantoprazole (PROTONIX) 40 MG tablet take 1 take twice a day with food 60 tablet 5   No current facility-administered medications for this visit.     Aspirin stopped  ___________________________________________________________________ Objective   Exam:  BP 120/78   Pulse 64   Ht 5\' 5"  (1.651 m)   Wt 186 lb (84.4 kg)   BMI 30.95 kg/m    General: this is a(n) Well-appearing woman with normal vocal quality   Eyes: sclera anicteric, no redness  ENT: oral mucosa moist without lesions, no cervical or supraclavicular lymphadenopathy, good dentition  CV: RRR without murmur, S1/S2, no JVD, no peripheral edema  Resp: clear to auscultation bilaterally, normal RR and effort noted  GI: soft, no tenderness, with active bowel sounds. No guarding or palpable organomegaly noted.  Skin; warm and  dry, no rash or jaundice noted  Neuro: awake, alert and oriented x 3. Normal gross motor function and fluent speech  Labs:  Cardiac testing as noted above CBC Latest Ref Rng & Units 05/10/2017 02/17/2016 07/03/2014  WBC 3.4 - 10.8 x10E3/uL 9.5 7.5 7.6  Hemoglobin 11.1 - 15.9 g/dL 12.9 13.6 13.6  Hematocrit 34.0 - 46.6 % 39.7 41.0 40.9  Platelets 150 - 379 x10E3/uL 330 288.0 290.0   CMP Latest Ref Rng & Units 06/30/2017 05/10/2017 02/17/2016  Glucose 65 - 99 mg/dL - 89 93  BUN 8 - 27 mg/dL - 13 14  Creatinine 0.57 - 1.00 mg/dL - 0.96 0.96  Sodium 134 - 144 mmol/L - 143 142  Potassium 3.5 - 5.2 mmol/L - 4.8 4.3  Chloride 96 - 106 mmol/L - 103 104  CO2 20 - 29 mmol/L - 22 29  Calcium 8.7 - 10.3 mg/dL - 9.9 10.0  Total Protein 6.0 - 8.5 g/dL 6.5 6.5 6.4  Total Bilirubin 0.0 - 1.2 mg/dL <0.2 <0.2 0.5  Alkaline Phos 39 - 117 IU/L 99 98 75  AST 0 - 40 IU/L 20 19 19   ALT 0 - 32 IU/L 25 25 29      Assessment: Encounter Diagnoses  Name Primary?  . Gastroesophageal reflux disease with esophagitis Yes  . Hepatic steatosis     It is not clear why she had worsening symptoms, nor why they are better at this point without any real change in her therapeutic regimen. We discussed how reflux can sometimes go through flares and cycles of worsening symptoms. She currently feels well, has a good appetite, no vomiting or weight loss. As such, I do not think she needs an upper endoscopy at this point.  Plan:  Continue present management, monitor symptoms and see me as needed. I expect she will be recalled for a colonoscopy at the appropriate interval by the [facility].  Thank you for the courtesy of this consult.  Please call me with any questions or concerns.  Nelida Meuse III  CC: Eulas Post, MD

## 2017-08-19 ENCOUNTER — Other Ambulatory Visit: Payer: Self-pay | Admitting: Family Medicine

## 2017-08-21 NOTE — Telephone Encounter (Signed)
Last refill 05/23/17 and last office visit 05/31/17.  Okay to fill?

## 2017-08-21 NOTE — Telephone Encounter (Signed)
Refill OK.  Would really like to see if she can scale back to at least lower dose (suggest one half bid) until follow up.

## 2017-08-23 ENCOUNTER — Encounter: Payer: Self-pay | Admitting: Family Medicine

## 2017-08-23 ENCOUNTER — Ambulatory Visit (INDEPENDENT_AMBULATORY_CARE_PROVIDER_SITE_OTHER): Payer: 59 | Admitting: Family Medicine

## 2017-08-23 VITALS — BP 120/80 | HR 85 | Temp 98.3°F | Wt 185.1 lb

## 2017-08-23 DIAGNOSIS — Z23 Encounter for immunization: Secondary | ICD-10-CM | POA: Diagnosis not present

## 2017-08-23 DIAGNOSIS — G4700 Insomnia, unspecified: Secondary | ICD-10-CM | POA: Diagnosis not present

## 2017-08-23 NOTE — Patient Instructions (Signed)
Try reducing the Lorazepam to one half tablet at night Out goal should be to try to get off the Lorazepam eventually.  Consider OTC Melatonin- 2mg  to 5 mg as needed.

## 2017-08-23 NOTE — Progress Notes (Signed)
Subjective:     Patient ID: Andrea Gregory, female   DOB: 03-Jan-1956, 61 y.o.   MRN: [id number]  HPI Patient is here for follow-up. She had atypical chest pains and possible reflux related symptoms last spring. She saw GI and symptoms are actually resolving. She had cardiac evaluation in the spring with nuclear stress test which was low risk and subsequently saw cardiology and had coronary calcium score which was very low. She has no exertional symptoms at this time. No dysphagia. She went on Protonix. Still has occasional breakthrough reflux symptoms at times  Chronic insomnia. Has been on lorazepam for many years. Sometimes takes half tablet in the morning and usually 1 tablet at night. She had difficult falling asleep and staying asleep. We had asked her come back in to discuss possible tapering off given concerns for increased fall risk as she gets older and possible cognitive decline with long-term use of benzodiazepines. She denies any depression issues.  She has not tried over-the-counter things such as melatonin. No regular alcohol use.  Past Medical History:  Diagnosis Date  . Aortic atherosclerosis ([place])    a. seen on CT 04/2017.  . Depression   . GERD (gastroesophageal reflux disease)   . Hyperlipidemia   . INSOMNIA, CHRONIC 08/25/2009  . MIGRAINE HEADACHE 08/25/2009  . Seasonal allergies   . SPRAIN AND STRAIN OF UNSPECIFIED SITE OF HAND 03/31/2010   pt states that she doesnt have this hx   Past Surgical History:  Procedure Laterality Date  . ABDOMINAL HYSTERECTOMY  1984  . APPENDECTOMY  2002  . BLADDER SUSPENSION  2005  . COLONOSCOPY WITH ESOPHAGOGASTRODUODENOSCOPY (EGD) N/A 03/28/2013   Procedure: COLONOSCOPY WITH ESOPHAGOGASTRODUODENOSCOPY (EGD);  Surgeon: Daneil Dolin, MD;  [facility];  Service: Endoscopy;  Laterality: N/A;  8:30-moved to [place] to notify pt  . DILATION AND CURETTAGE OF UTERUS  1983  . exploratory laprotomy  1994  . MALONEY DILATION N/A  03/28/2013   Procedure: Venia Minks DILATION;  Surgeon: Daneil Dolin, MD;  [facility];  Service: Endoscopy;  Laterality: N/A;  . miscarriage  1978  . SAVORY DILATION N/A 03/28/2013   Procedure: SAVORY DILATION;  Surgeon: Daneil Dolin, MD;  [facility];  Service: Endoscopy;  Laterality: N/A;  . TONSILLECTOMY      reports that she quit smoking about 2 years ago. Her smoking use included Cigarettes. She has a 8.75 pack-year smoking history. She has never used smokeless tobacco. She reports that she does not drink alcohol or use drugs. family history includes Alcohol abuse in her other; Angina (age of onset: 53) in her mother; Breast cancer in her paternal aunt; Colon cancer in her paternal grandmother; Diabetes in her other; Hypertension in her other; Mental illness in her other; 43 / Stillbirths in her other; Ovarian cancer in her sister; Stroke in her other. No Known Allergies   Review of Systems  Constitutional: Negative for fatigue.  Eyes: Negative for visual disturbance.  Respiratory: Negative for cough, chest tightness, shortness of breath and wheezing.   Cardiovascular: Negative for chest pain, palpitations and leg swelling.  Neurological: Negative for dizziness, seizures, syncope, weakness, light-headedness and headaches.       Objective:   Physical Exam  Constitutional: She appears well-developed and well-nourished.  Eyes: Pupils are equal, round, and reactive to light.  Neck: Neck supple. No JVD present. No thyromegaly present.  Cardiovascular: Normal rate and regular rhythm.  Exam reveals no gallop.  Pulmonary/Chest: Effort normal and breath sounds normal. No respiratory distress. She has no wheezes. She has no rales.  Musculoskeletal: She exhibits no edema.  Neurological: She is alert.  Psychiatric: She has a normal mood and affect. Her behavior is normal.       Assessment:     #1 chronic insomnia  #2 history of GERD stable on Protonix     Plan:     -We recommend she try tapering her lorazepam at night from 1 mg to one half tablet and consider over-the-counter melatonin 2-5 mg daily at bedtime -After several weeks try tapering off lorazepam. -Flu vaccine given  Eulas Post MD [facility]'

## 2017-11-20 ENCOUNTER — Other Ambulatory Visit: Payer: Self-pay

## 2017-11-22 MED ORDER — PANTOPRAZOLE SODIUM 40 MG PO TBEC: DELAYED_RELEASE_TABLET | ORAL | 3 refills | Status: DC

## 2017-11-23 ENCOUNTER — Other Ambulatory Visit: Payer: Self-pay | Admitting: Family Medicine

## 2017-11-23 NOTE — Telephone Encounter (Signed)
Last office visit 08/23/17 -We recommend she try tapering her lorazepam at night from 1 mg to one half tablet and consider over-the-counter melatonin 2-5 mg daily at bedtime -After several weeks try tapering off lorazepam Okay to refill?

## 2017-11-24 NOTE — Telephone Encounter (Signed)
Clarify if she was able to taper back to one half tablet.  If so, refill Lorazepam 0.5 mg qhs prn   May take in combination with Melatonin.

## 2017-11-28 MED ORDER — LORAZEPAM 0.5 MG PO TABS: 0.5000 mg | ORAL_TABLET | Freq: Every day | ORAL | 1 refills | Status: DC

## 2017-11-28 NOTE — Telephone Encounter (Signed)
Spoke with patient and she is able to take 0.5 mg and would like a quantity of 45.  rx called in.

## 2018-02-15 ENCOUNTER — Other Ambulatory Visit: Payer: Self-pay | Admitting: Family Medicine

## 2018-02-16 NOTE — Telephone Encounter (Signed)
Rx done. 

## 2018-02-16 NOTE — Telephone Encounter (Signed)
Refill once 

## 2018-03-18 ENCOUNTER — Other Ambulatory Visit: Payer: Self-pay | Admitting: Family Medicine

## 2018-03-19 NOTE — Telephone Encounter (Signed)
Last refill 02/16/18 and last office visit 08/23/17.  Okay to fill?

## 2018-03-19 NOTE — Telephone Encounter (Signed)
Refill with 2 additional refills. 

## 2018-08-06 ENCOUNTER — Other Ambulatory Visit: Payer: Self-pay

## 2018-08-10 ENCOUNTER — Telehealth: Payer: Self-pay | Admitting: *Deleted

## 2018-08-10 NOTE — Telephone Encounter (Signed)
OK 

## 2018-08-10 NOTE — Telephone Encounter (Signed)
[place] visit 08/23/17 -We recommend she try tapering her lorazepam at night from 1 mg to one half tablet and consider over-the-counter melatonin 2-5 mg daily at bedtime -After several weeks try tapering off lorazepam.  Refill Lorazepam 0.5 mg take one tablet at bedtime    Okay to refill?

## 2018-08-13 MED ORDER — LORAZEPAM 0.5 MG PO TABS: 0.5000 mg | ORAL_TABLET | Freq: Every day | ORAL | 0 refills | Status: DC

## 2018-10-15 ENCOUNTER — Other Ambulatory Visit: Payer: Self-pay | Admitting: Family Medicine

## 2018-10-15 NOTE — Telephone Encounter (Signed)
Last refill 08/13/18 Last office visit 08/23/18  -We recommend she try tapering her lorazepam at night from 1 mg to one half tablet and consider over-the-counter melatonin 2-5 mg daily at bedtime -After several weeks try tapering off lorazepam.  Okay to refill?

## 2018-10-15 NOTE — Telephone Encounter (Signed)
Needs office follow up

## 2019-01-09 IMAGING — NM NM MISC PROCEDURE
8 series · 48 of 48 positions shown · non-contrast
Comparison: none

[Series 1: wbr_r-proj_st rest · 6.51mm/px · 6 of 64 frames shown]
[frame 6/64]
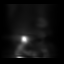
[frame 16/64]
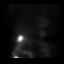
[frame 27/64]
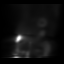
[frame 38/64]
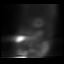
[frame 48/64]
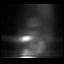
[frame 59/64]
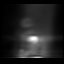

[Series 1: rest-mc · 6.51mm/px · 6 of 64 frames shown]
[frame 6/64]
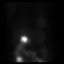
[frame 16/64]
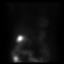
[frame 27/64]
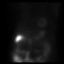
[frame 38/64]
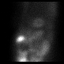
[frame 48/64]
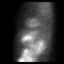
[frame 59/64]
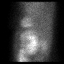

[Series 1: rest · 6.51mm/px · 6 of 64 frames shown]
[frame 6/64]
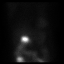
[frame 16/64]
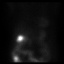
[frame 27/64]
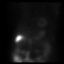
[frame 38/64]
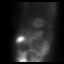
[frame 48/64]
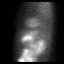
[frame 59/64]
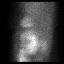

[Series 1: wbr_r-proj_st rest-mc · 6.51mm/px · 6 of 64 frames shown]
[frame 6/64]
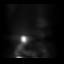
[frame 16/64]
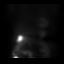
[frame 27/64]
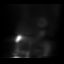
[frame 38/64]
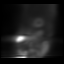
[frame 48/64]
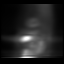
[frame 59/64]
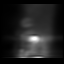

[Series 2: stress · 6.51mm/px · 6 of 64 frames shown (1 of 2)]
[frame 6/64]
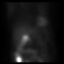
[frame 16/64]
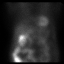
[frame 27/64]
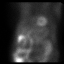
[frame 38/64]
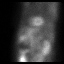
[frame 48/64]
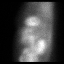
[frame 59/64]
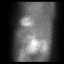

[Series 2: wbr_s-proj_st stress · 6.51mm/px · 6 of 512 frames shown (1 of 2)]
[frame 43/512]
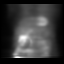
[frame 128/512]
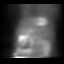
[frame 214/512]
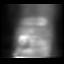
[frame 299/512]
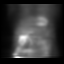
[frame 384/512]
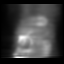
[frame 470/512]
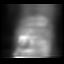

[Series 2: wbr_s-proj_st stress · 6.51mm/px · 6 of 64 frames shown (2 of 2)]
[frame 6/64]
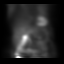
[frame 16/64]
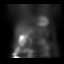
[frame 27/64]
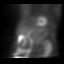
[frame 38/64]
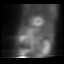
[frame 48/64]
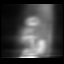
[frame 59/64]
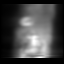

[Series 2: stress · 6.51mm/px · 6 of 512 frames shown (2 of 2)]
[frame 43/512]
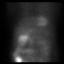
[frame 128/512]
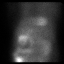
[frame 214/512]
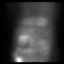
[frame 299/512]
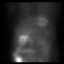
[frame 384/512]
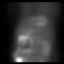
[frame 470/512]
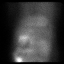

[48 of 48 positions shown; findings below may reference images not displayed]

Canned report from images found in remote index.

Refer to host system for actual result text.

## 2019-11-01 ENCOUNTER — Other Ambulatory Visit: Payer: Self-pay

## 2019-11-01 DIAGNOSIS — Z20822 Contact with and (suspected) exposure to covid-19: Secondary | ICD-10-CM

## 2019-11-02 ENCOUNTER — Telehealth: Payer: Self-pay

## 2019-11-02 LAB — NOVEL CORONAVIRUS, NAA: SARS-CoV-2, NAA: DETECTED — AB

## 2019-11-02 NOTE — Telephone Encounter (Signed)
Pt called for covid results advised results are not back. Sent pt MyChart enrollment link.

## 2019-11-08 ENCOUNTER — Telehealth: Payer: Self-pay | Admitting: *Deleted

## 2019-11-08 NOTE — Telephone Encounter (Signed)
Pt called stating that she had tested positive for covid on 12/11. And she feels like she is not getting any better. She has fatigue and just want to lay around. She also has been having a low grade temp. And today it has been 99.8. And dose not feel like eating or drinking much. Her husband has been trying to get her to up and move more. Advised pt to get her rest, but also to get fresh air and hydrate. She has not communicated with her provider that she is positive. Advised her to do that so he can advise her in treating her symptoms. She voiced understanding.

## 2019-11-08 NOTE — Telephone Encounter (Signed)
FYI

## 2019-11-08 NOTE — Telephone Encounter (Signed)
Message Routed to PCP CMA 

## 2019-11-10 NOTE — Telephone Encounter (Signed)
I would call her and see if she would like a Doxy follow up.

## 2019-11-11 NOTE — Telephone Encounter (Signed)
Called patient and LMOVM to return call  [place] for [facility] to Discuss results / PCP / recommendations / Schedule patient  Left a detailed voice message to let patient know to call back to schedule a virtual visit today with Dr. Elease Hashimoto.  CRM Created.

## 2019-11-12 ENCOUNTER — Other Ambulatory Visit: Payer: Self-pay

## 2019-11-12 ENCOUNTER — Telehealth (INDEPENDENT_AMBULATORY_CARE_PROVIDER_SITE_OTHER): Payer: Commercial Managed Care - PPO | Admitting: Family Medicine

## 2019-11-12 DIAGNOSIS — K2100 Gastro-esophageal reflux disease with esophagitis, without bleeding: Secondary | ICD-10-CM

## 2019-11-12 DIAGNOSIS — F411 Generalized anxiety disorder: Secondary | ICD-10-CM | POA: Diagnosis not present

## 2019-11-12 DIAGNOSIS — F339 Major depressive disorder, recurrent, unspecified: Secondary | ICD-10-CM | POA: Diagnosis not present

## 2019-11-12 DIAGNOSIS — U071 COVID-19: Secondary | ICD-10-CM | POA: Diagnosis not present

## 2019-11-12 MED ORDER — PANTOPRAZOLE SODIUM 40 MG PO TBEC: DELAYED_RELEASE_TABLET | ORAL | 1 refills | Status: DC

## 2019-11-12 MED ORDER — ESCITALOPRAM OXALATE 20 MG PO TABS: 20.0000 mg | ORAL_TABLET | Freq: Every day | ORAL | 3 refills | Status: DC

## 2019-11-12 NOTE — Progress Notes (Signed)
This visit type was conducted due to national recommendations for restrictions regarding the COVID-19 pandemic in an effort to limit this patient's exposure and mitigate transmission in our community.   Virtual Visit via Video Note  I connected with Andrea Gregory on 11/12/19 at 11:45 AM EST by a video enabled telemedicine application and verified that I am speaking with the correct person using two identifiers.  Location patient: home Location provider:work or home office Persons participating in the virtual visit: patient, provider  I discussed the limitations of evaluation and management by telemedicine and the availability of in person appointments. The patient expressed understanding and agreed to proceed.   HPI:  Andrea Gregory was diagnosed with Covid recently on 11/01/2019.  She had fairly profound fatigue and weakness and had fever until about 3 or 4 days ago.  She is gradually improving.  She has not had any cough or dyspnea.  She had some diarrhea initially but none now.  Appetite is improving.  She never lost taste or smell.  She has just finished her quarantine.  Husband never had any symptoms.  Andrea Gregory states she acquired this from a Gregory-worker.  She is requesting refills of Lexapro.  She has history of some chronic anxiety symptoms and has been stable on Lexapro for years.  She takes lorazepam as needed and we have encouraged her not to use this regularly.  She also has history of GERD and takes Protonix and requesting refills.  She is tried tapering off unsuccessfully in the past.  No recent dysphagia.  ROS: See pertinent positives and negatives per HPI.  Past Medical History:  Diagnosis Date  . Aortic atherosclerosis ([place])    a. seen on CT 04/2017.  . Depression   . GERD (gastroesophageal reflux disease)   . Hyperlipidemia   . INSOMNIA, CHRONIC 08/25/2009  . MIGRAINE HEADACHE 08/25/2009  . Seasonal allergies   . SPRAIN AND STRAIN OF UNSPECIFIED SITE OF HAND 03/31/2010   pt states that  she doesnt have this hx    Past Surgical History:  Procedure Laterality Date  . ABDOMINAL HYSTERECTOMY  1984  . APPENDECTOMY  2002  . BLADDER SUSPENSION  2005  . COLONOSCOPY WITH ESOPHAGOGASTRODUODENOSCOPY (EGD) N/A 03/28/2013   Procedure: COLONOSCOPY WITH ESOPHAGOGASTRODUODENOSCOPY (EGD);  Surgeon: Daneil Dolin, MD;  [facility];  Service: Endoscopy;  Laterality: N/A;  8:30-moved to [place] to notify pt  . DILATION AND CURETTAGE OF UTERUS  1983  . exploratory laprotomy  1994  . MALONEY DILATION N/A 03/28/2013   Procedure: Venia Minks DILATION;  Surgeon: Daneil Dolin, MD;  [facility];  Service: Endoscopy;  Laterality: N/A;  . miscarriage  1978  . SAVORY DILATION N/A 03/28/2013   Procedure: SAVORY DILATION;  Surgeon: Daneil Dolin, MD;  [facility];  Service: Endoscopy;  Laterality: N/A;  . TONSILLECTOMY      Family History  Problem Relation Age of Onset  . Diabetes Other   . Hypertension Other   . Stroke Other   . Miscarriages / Stillbirths Other   . Alcohol abuse Other   . Mental illness Other   . Ovarian cancer Sister        ovarian  . Colon cancer Paternal Grandmother        age >32  . Breast cancer Paternal Aunt        two  . Angina Mother 33  . Lung cancer Neg Hx     SOCIAL HX: Quit smoking 2016  Current Outpatient Medications:  .  atorvastatin (LIPITOR) 20 MG tablet, Take 1 tablet (20 mg total) by mouth daily., Disp: 90 tablet, Rfl: 3 .  escitalopram (LEXAPRO) 20 MG tablet, Take 1 tablet (20 mg total) by mouth daily., Disp: 90 tablet, Rfl: 3 .  LORazepam (ATIVAN) 0.5 MG tablet, Take 1 tablet (0.5 mg total) by mouth at bedtime., Disp: 45 tablet, Rfl: 0 .  pantoprazole (PROTONIX) 40 MG tablet, take 1 take twice a day with food, Disp: 180 tablet, Rfl: 1  EXAM:  VITALS per patient if applicable:  GENERAL: alert, oriented, appears well and in no acute distress  HEENT: atraumatic, conjunttiva clear, no obvious abnormalities  on inspection of external nose and ears  NECK: normal movements of the head and neck  LUNGS: on inspection no signs of respiratory distress, breathing rate appears normal, no obvious gross SOB, gasping or wheezing  CV: no obvious cyanosis  MS: moves all visible extremities without noticeable abnormality  PSYCH/NEURO: pleasant and cooperative, no obvious depression or anxiety, speech and thought processing grossly intact  ASSESSMENT AND PLAN:  Discussed the following assessment and plan:  #1 recent diagnosis of COVID-19-clinically improving.  Still has a lot of fatigue but no fever and no dyspnea  -Gradually increase activities as tolerated.  Her quarantine period has just ended  #2 history of GERD stable on Protonix  -Refill Protonix  #3 history of chronic anxiety-stable  -Refill Lexapro for 1 year     I discussed the assessment and treatment plan with the patient. The patient was provided an opportunity to ask questions and all were answered. The patient agreed with the plan and demonstrated an understanding of the instructions.   The patient was advised to call back or seek an in-person evaluation if the symptoms worsen or if the condition fails to improve as anticipated.    Carolann Littler, MD

## 2019-11-19 ENCOUNTER — Telehealth: Payer: Self-pay

## 2019-11-19 NOTE — Telephone Encounter (Signed)
Copied from [place] [id number]. Topic: General - Other >> Nov 19, 2019  4:29 PM Percell Belt A wrote: Reason for CRM: pt called in stated that the ins compy is deputing the amount taken of the  pantoprazole (PROTONIX) 40 MG tablet [id number] .  They pharmacy is needing to verify this info.  It is [facility] in [place] 

## 2019-11-19 NOTE — Telephone Encounter (Signed)
She is followed by [facility].  Unless they instructed her to reduce dose, that is the correct dose.

## 2019-11-19 NOTE — Telephone Encounter (Signed)
Please see message.  These are the instructions on the prescription: take 1 take twice a day with food  Please advise if correct.

## 2019-11-20 ENCOUNTER — Telehealth: Payer: Self-pay | Admitting: Gastroenterology

## 2019-11-20 NOTE — Telephone Encounter (Signed)
OK to proceed with this PA?

## 2019-11-20 NOTE — Telephone Encounter (Signed)
Patient called states there is a discrepancy somewhere with the RF request for Pantoprazole 40mg  with the pharmacy [facility])

## 2019-11-20 NOTE — Telephone Encounter (Signed)
Noted! Will call pt to see if she wants to schedule visit.

## 2019-11-20 NOTE — Telephone Encounter (Signed)
Patient states her insurance is asking for approval for twice daily pantoprazole but that Dr Anastasio Auerbach office told her that our office would need to be the one to take care of that. I advised that Dr Elease Hashimoto wrote the pantoprazole script and unfortunately, she has not seen our office in over 2 years, so she would need to see [place] for a visit for [place] to start resending medications for her.  I also advised that it seems like her insurance may just been needing a prior authorization for twice daily dosing of PPI. She verbalizes understanding of this information.

## 2019-11-20 NOTE — Telephone Encounter (Signed)
Patient informed of the message below.  Also given the number to call [facility] at [phone].

## 2019-11-20 NOTE — Telephone Encounter (Signed)
Patient would like the nurse to call her regarding a PA for the medication she is requesting.  Patient does not want to come into the office, due to [place].  Please advise and call patient to discuss asap at [phone]

## 2019-11-20 NOTE — Telephone Encounter (Signed)
yes

## 2019-11-20 NOTE — Telephone Encounter (Signed)
Left message to return phone call.

## 2019-12-03 NOTE — Telephone Encounter (Signed)
Called patient and LMOVM to return call  [place] for [facility] to Discuss results / PCP / recommendations / Schedule patient  I called the patient and left a detailed VM to let her know that we need an updated insurance card since the last one we have on file is from 2018. Then we can proceed with the correct information for the prior authorization.  CRM Created.

## 2019-12-11 ENCOUNTER — Encounter: Payer: Self-pay | Admitting: Family Medicine

## 2019-12-11 NOTE — Telephone Encounter (Signed)
Started prior authorization for the Protonix 40 mg and sent to Cover My Meds. Key # [id number] Case ID: [id number]  Patient sent new insurance card through [employer]. Will take copy to front desk to apply.

## 2019-12-16 NOTE — Telephone Encounter (Signed)
I received a notice of approval from [facility] for the Pantoprazole 40 mg.  Case number: [id number] Approved through 12/10/2020

## 2020-06-09 ENCOUNTER — Other Ambulatory Visit: Payer: Self-pay | Admitting: Family Medicine

## 2020-11-27 ENCOUNTER — Encounter: Payer: Self-pay | Admitting: Orthopedic Surgery

## 2020-12-11 ENCOUNTER — Other Ambulatory Visit: Payer: Self-pay

## 2020-12-11 ENCOUNTER — Ambulatory Visit (INDEPENDENT_AMBULATORY_CARE_PROVIDER_SITE_OTHER): Payer: Commercial Managed Care - PPO | Admitting: Orthopedic Surgery

## 2020-12-11 ENCOUNTER — Ambulatory Visit: Payer: Commercial Managed Care - PPO

## 2020-12-11 ENCOUNTER — Encounter: Payer: Self-pay | Admitting: Orthopedic Surgery

## 2020-12-11 VITALS — BP 188/101 | HR 69 | Ht 65.0 in | Wt 191.0 lb

## 2020-12-11 DIAGNOSIS — G8929 Other chronic pain: Secondary | ICD-10-CM

## 2020-12-11 DIAGNOSIS — M25562 Pain in left knee: Secondary | ICD-10-CM

## 2020-12-11 DIAGNOSIS — M1712 Unilateral primary osteoarthritis, left knee: Secondary | ICD-10-CM | POA: Diagnosis not present

## 2020-12-11 NOTE — Patient Instructions (Signed)

## 2020-12-11 NOTE — Progress Notes (Signed)
New Patient Visit  Assessment: Andrea Gregory is a 65 y.o. female with the following: Mild to moderate left knee arthritis; possible acute medial mensicus tear  Plan: Andrea Gregory has mild to moderate arthritis in her left knee.  We reviewed the radiographs in clinic today, and I outlined the natural progression.  In particular, the pain she describes at night is most consistent with arthritis.  However, it is possible that she has an isolated medial meniscus tear, which is causing some of her symptoms, although she does not describe an injury with acute onset swelling.  We had an extensive discussion regarding all potential treatment options, including continuing with the current treatment. NSAIDs are the most appropriate medications, and these are available OTC or via prescription.  I have urged them to remain active, and they can continue with activities on their own, or we can refer them to physical therapy.    After discussing all of these options, the patient has elected to proceed with a steroid injection and home exercise program.    Procedure note injection Left knee joint   Verbal consent was obtained to inject the left knee joint  Timeout was completed to confirm the site of injection.  The skin was prepped with alcohol and ethyl chloride was sprayed at the injection site.  A 21-gauge needle was used to inject 40 mg of Depo-Medrol and 1% lidocaine (3 cc) into the left knee using an anterolateral approach.  There were no complications. A sterile bandage was applied.   Follow-up: Return if symptoms worsen or fail to improve.  Subjective:  Chief Complaint  Patient presents with  . Knee Pain    Left knee pain, x 2 months or so. Worsening, NKI, pain noticed with up and down stairs, and getting out of car, medial pain, + sleep disturbance.    History of Present Illness: Andrea Gregory is a 65 y.o. female who presents for evaluation of left knee pain.  She has been having left knee  pain for the last 2 months.  She denies a specific injury.  Pain was initially all over, but is primarily located in the medial aspect of her knee.  She notices the pain when using the stairs, getting into and out of the car.  She also has pain at night.  She will take ibuprofen occasionally for her pain.  She is also tried using a brace.  She feels as though her left knee is swollen all the time.  She does not remember a time when her knee was significantly swollen.  She has not done any physical therapy.  No previous injury to her knee.  She has not had a steroid injection.   Review of Systems: No fevers or chills No numbness or tingling No chest pain No shortness of breath No bowel or bladder dysfunction No GI distress No headaches   Medical History:  Past Medical History:  Diagnosis Date  . Aortic atherosclerosis ([place])    a. seen on CT 04/2017.  . Depression   . GERD (gastroesophageal reflux disease)   . Hyperlipidemia   . INSOMNIA, CHRONIC 08/25/2009  . MIGRAINE HEADACHE 08/25/2009  . Seasonal allergies   . SPRAIN AND STRAIN OF UNSPECIFIED SITE OF HAND 03/31/2010   pt states that she doesnt have this hx    Past Surgical History:  Procedure Laterality Date  . ABDOMINAL HYSTERECTOMY  1984  . APPENDECTOMY  2002  . BLADDER SUSPENSION  2005  . COLONOSCOPY WITH  ESOPHAGOGASTRODUODENOSCOPY (EGD) N/A 03/28/2013   Procedure: COLONOSCOPY WITH ESOPHAGOGASTRODUODENOSCOPY (EGD);  Surgeon: Daneil Dolin, MD;  Location: [facility];  Service: Endoscopy;  Laterality: N/A;  8:30-moved to [place] to notify pt  . DILATION AND CURETTAGE OF UTERUS  1983  . exploratory laprotomy  1994  . MALONEY DILATION N/A 03/28/2013   Procedure: Venia Minks DILATION;  Surgeon: Daneil Dolin, MD;  Location: [facility];  Service: Endoscopy;  Laterality: N/A;  . miscarriage  1978  . SAVORY DILATION N/A 03/28/2013   Procedure: SAVORY DILATION;  Surgeon: Daneil Dolin, MD;  Location: [facility];  Service:  Endoscopy;  Laterality: N/A;  . TONSILLECTOMY      Family History  Problem Relation Age of Onset  . Diabetes Other   . Hypertension Other   . Stroke Other   . Miscarriages / Stillbirths Other   . Alcohol abuse Other   . Mental illness Other   . Ovarian cancer Sister        ovarian  . Colon cancer Paternal Grandmother        age >66  . Breast cancer Paternal Aunt        two  . Angina Mother 62  . Lung cancer Neg Hx    Social History   Tobacco Use  . Smoking status: Former Smoker    Packs/day: 0.25    Years: 35.00    Pack years: 8.75    Types: Cigarettes    Quit date: 04/12/2015    Years since quitting: 5.6  . Smokeless tobacco: Never Used  Substance Use Topics  . Alcohol use: No  . Drug use: No    No Known Allergies  Current Meds  Medication Sig  . escitalopram (LEXAPRO) 20 MG tablet Take 1 tablet (20 mg total) by mouth daily.  . fluticasone (CUTIVATE) 0.05 % cream fluticasone propionate 0.05 % topical cream  . loratadine (CLARITIN) 10 MG tablet loratadine 10 mg tablet  TAKE 1 TABLET BY MOUTH EVERY DAY FOR ALLERGIES  . pantoprazole (PROTONIX) 40 MG tablet TAKE 1 TABLET BY MOUTH TWICE DAILY WITH FOOD    Objective: BP (!) 188/101   Pulse 69   Ht 5\' 5"  (1.651 m)   Wt 191 lb (86.6 kg)   BMI 31.78 kg/m   Physical Exam:  General: Alert and oriented, no acute distress Gait: Left-sided antalgic gait  Evaluation of the left knee demonstrates a mild effusion.  She is able to achieve full extension.  She does not have a Baker's cyst.  Flexion to 120 degrees with mild discomfort at the extreme of flexion.  Tenderness palpation along the medial joint line.  Mild tenderness palpation along the lateral joint line.  Crepitus is appreciated with range of motion testing.  Negative Lachman.    IMAGING: I personally ordered and reviewed the following images   X-rays left knee were obtained in clinic today and demonstrates mild varus alignment overall.  There is a loss of  joint space medially, with well-maintained joint space laterally.  Impression: Mild to moderate left knee arthritis.   New Medications:  No orders of the defined types were placed in this encounter.     Mordecai Rasmussen, MD  12/11/2020 4:32 PM

## 2021-04-12 ENCOUNTER — Other Ambulatory Visit: Payer: Self-pay

## 2021-04-12 ENCOUNTER — Ambulatory Visit (INDEPENDENT_AMBULATORY_CARE_PROVIDER_SITE_OTHER): Payer: Commercial Managed Care - PPO | Admitting: Orthopedic Surgery

## 2021-04-12 ENCOUNTER — Ambulatory Visit: Payer: Medicare Other

## 2021-04-12 VITALS — BP 177/79 | HR 84 | Ht 65.0 in | Wt 194.0 lb

## 2021-04-12 DIAGNOSIS — M5410 Radiculopathy, site unspecified: Secondary | ICD-10-CM

## 2021-04-12 DIAGNOSIS — M23322 Other meniscus derangements, posterior horn of medial meniscus, left knee: Secondary | ICD-10-CM | POA: Diagnosis not present

## 2021-04-12 DIAGNOSIS — N890 Mild vaginal dysplasia: Secondary | ICD-10-CM | POA: Insufficient documentation

## 2021-04-12 DIAGNOSIS — D398 Neoplasm of uncertain behavior of other specified female genital organs: Secondary | ICD-10-CM | POA: Insufficient documentation

## 2021-04-12 MED ORDER — GABAPENTIN 100 MG PO CAPS: 100.0000 mg | ORAL_CAPSULE | Freq: Three times a day (TID) | ORAL | 2 refills | Status: DC

## 2021-04-12 MED ORDER — PREDNISONE 10 MG (48) PO TBPK
ORAL_TABLET | Freq: Every day | ORAL | 0 refills | Status: DC
Start: 2021-04-12 — End: 2021-05-12

## 2021-04-12 NOTE — Progress Notes (Signed)
Chief Complaint  Patient presents with  . Knee Pain    Left knee painful long duration worse recently     65 year old female seen in January for pain had an injection took ibuprofen pain is worsening over the medial joint line occasionally gives out.  Pain is worse when lying down and is now progressed to include left leg pain lower back and hip pain  Second office visit for this problem   No Known Allergies  Examination shows a well-developed well-nourished female awake alert and oriented x3 tenderness along the medial joint line and peripatellar region tenderness lower back negative straight leg raise decree sensation medial aspect of left leg strength is normal  Knee exam is notable for positive McMurray's sign along with the medial joint line tenderness  Plan  Radiculopathy left leg treat medically with Neurontin and Medrol Dosepak  Greater than 6 weeks of medial knee pain left knee treated with injection and anti-inflammatories for more than 6 weeks  Recommend MRI   Encounter Diagnoses  Name Primary?  . Radicular pain of left lower extremity Yes  . 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

## 2021-04-12 NOTE — Patient Instructions (Signed)
the MRI does not require approval from your insurance please go ahead and call to schedule your appointment with [facility] within at least one (1) week.   Central Scheduling [phone]

## 2021-04-23 ENCOUNTER — Telehealth: Payer: Self-pay | Admitting: Orthopedic Surgery

## 2021-04-23 ENCOUNTER — Ambulatory Visit (HOSPITAL_COMMUNITY)
Admission: RE | Admit: 2021-04-23 | Discharge: 2021-04-23 | Disposition: A | Discharge status: Home / Self Care | Payer: Commercial Managed Care - PPO | Source: Ambulatory Visit | Attending: Orthopedic Surgery | Admitting: Orthopedic Surgery

## 2021-04-23 ENCOUNTER — Telehealth: Payer: Self-pay | Admitting: Radiology

## 2021-04-23 DIAGNOSIS — M23322 Other meniscus derangements, posterior horn of medial meniscus, left knee: Secondary | ICD-10-CM

## 2021-04-23 NOTE — Telephone Encounter (Signed)
-----   Message from Carole Civil, MD sent at 04/23/2021 10:05 AM EDT ----- Cancel appt for Monday   Call her to schedule salk medial menisectomy first available

## 2021-04-23 NOTE — Telephone Encounter (Signed)
Left a message with Delany to let her know that she does have a torn medial meniscus and if the knee is still hurting her we would advise arthroscopic surgery  She should have an appointment coming up to discuss the situation further

## 2021-04-23 NOTE — Telephone Encounter (Signed)
Andrea Gregory called me back we discussed her MRI she still having knee pain  Her back pain and leg radicular symptoms got better with gabapentin and prednisone Dosepak  She does office work  She can have arthroscopy medial meniscectomy at her convenience  Her Monday appointment has been canceled  We can call her to schedule the surgery at her convenience.  The procedure has been fully reviewed with the patient; The risks and benefits of surgery have been discussed and explained and understood. Alternative treatment has also been reviewed, questions were encouraged and answered. The postoperative plan is also been reviewed.

## 2021-04-26 ENCOUNTER — Other Ambulatory Visit: Payer: Self-pay | Admitting: Orthopedic Surgery

## 2021-04-26 DIAGNOSIS — M23322 Other meniscus derangements, posterior horn of medial meniscus, left knee: Secondary | ICD-10-CM

## 2021-04-26 NOTE — Telephone Encounter (Signed)
She called back. Surgery scheduled mailed her an order for [facility]

## 2021-04-26 NOTE — Telephone Encounter (Signed)
Left message for her to call back

## 2021-05-03 ENCOUNTER — Ambulatory Visit: Payer: Commercial Managed Care - PPO | Admitting: Orthopedic Surgery

## 2021-05-12 ENCOUNTER — Telehealth: Payer: Self-pay | Admitting: Orthopedic Surgery

## 2021-05-12 ENCOUNTER — Other Ambulatory Visit: Payer: Self-pay | Admitting: Orthopedic Surgery

## 2021-05-12 DIAGNOSIS — M5410 Radiculopathy, site unspecified: Secondary | ICD-10-CM

## 2021-05-12 MED ORDER — PREDNISONE 10 MG (48) PO TBPK: ORAL_TABLET | Freq: Every day | ORAL | 0 refills | Status: DC

## 2021-05-12 MED ORDER — GABAPENTIN 300 MG PO CAPS: 300.0000 mg | ORAL_CAPSULE | Freq: Three times a day (TID) | ORAL | 0 refills | Status: DC

## 2021-05-12 NOTE — Telephone Encounter (Signed)
Patient called to relay that her left knee pain is "excruciating" today; states yesterday was bearable but not as bad as today. States she is taking over the counter ibuprofen, which is "making her blood pressure go up."  Asking if there is anything she can take, since surgery is coming up on 05/21/21. Pharmacy is [facility], [address] [place].

## 2021-05-13 NOTE — Telephone Encounter (Signed)
Patient returned call; aware of prescription and appointment for Monday, 05/17/21.

## 2021-05-13 NOTE — Telephone Encounter (Signed)
Worked in on Monday have left message for her to call back and let me know she got my message.

## 2021-05-14 NOTE — Patient Instructions (Signed)
Your procedure is scheduled on: 05/21/2021  Report to [facility] at     7:05 AM.  Call this number if you have problems the morning of surgery: [phone]   Remember:   Do not Eat or Drink after midnight         No Smoking the morning of surgery  :  Take these medicines the morning of surgery with A SIP OF WATER: Lexapro, Pantoprazole, and prednisone             (Gabapentin, claritin and flonase if needed)   Do not wear jewelry, make-up or nail polish.  Do not wear lotions, powders, or perfumes. You may wear deodorant.  Do not shave 48 hours prior to surgery. Men may shave face and neck.  Do not bring valuables to the hospital.  Contacts, dentures or bridgework may not be worn into surgery.  Leave suitcase in the car. After surgery it may be brought to your room.  For patients admitted to the hospital, checkout time is 11:00 AM the day of discharge.   Patients discharged the day of surgery will not be allowed to drive home.    Special Instructions: Shower using CHG night before surgery and shower the day of surgery use CHG.  Use special wash - you have one bottle of CHG for all showers.  You should use approximately 1/2 of the bottle for each shower.  How to Use Chlorhexidine for Bathing Chlorhexidine gluconate (CHG) is a germ-killing (antiseptic) solution that is used to clean the skin. It can get rid of the bacteria that normally live on the skin and can keep them away for about 24 hours. To clean your skin with CHG, you may be given: A CHG solution to use in the shower or as part of a sponge bath. A prepackaged cloth that contains CHG. Cleaning your skin with CHG may help lower the risk for infection: While you are staying in the intensive care unit of the hospital. If you have a vascular access, such as a central line, to provide short-term or long-term access to your veins. If you have a catheter to drain urine from your bladder. If you are on a ventilator. A  ventilator is a machine that helps you breathe by moving air in and out of your lungs. After surgery. What are the risks? Risks of using CHG include: A skin reaction. Hearing loss, if CHG gets in your ears. Eye injury, if CHG gets in your eyes and is not rinsed out. The CHG product catching fire. Make sure that you avoid smoking and flames after applying CHG to your skin. Do not use CHG: If you have a chlorhexidine allergy or have previously reacted to chlorhexidine. On babies younger than 2 months of age. How to use CHG solution Use CHG only as told by your health care provider, and follow the instructions on the label. Use the full amount of CHG as directed. Usually, this is one bottle. During a shower Follow these steps when using CHG solution during a shower (unless your health care provider gives you different instructions): Start the shower. Use your normal soap and shampoo to wash your face and hair. Turn off the shower or move out of the shower stream. Pour the CHG onto a clean washcloth. Do not use any type of brush or rough-edged sponge. Starting at your neck, lather your body down to your toes. Make sure you follow these instructions: If you will be having surgery,  pay special attention to the part of your body where you will be having surgery. Scrub this area for at least 1 minute. Do not use CHG on your head or face. If the solution gets into your ears or eyes, rinse them well with water. Avoid your genital area. Avoid any areas of skin that have broken skin, cuts, or scrapes. Scrub your back and under your arms. Make sure to wash skin folds. Let the lather sit on your skin for 1-2 minutes or as long as told by your health care provider. Thoroughly rinse your entire body in the shower. Make sure that all body creases and crevices are rinsed well. Dry off with a clean towel. Do not put any substances on your body afterward--such as powder, lotion, or perfume--unless you are  told to do so by your health care provider. Only use lotions that are recommended by the manufacturer. Put on clean clothes or pajamas. If it is the night before your surgery, sleep in clean sheets.  During a sponge bath Follow these steps when using CHG solution during a sponge bath (unless your health care provider gives you different instructions): Use your normal soap and shampoo to wash your face and hair. Pour the CHG onto a clean washcloth. Starting at your neck, lather your body down to your toes. Make sure you follow these instructions: If you will be having surgery, pay special attention to the part of your body where you will be having surgery. Scrub this area for at least 1 minute. Do not use CHG on your head or face. If the solution gets into your ears or eyes, rinse them well with water. Avoid your genital area. Avoid any areas of skin that have broken skin, cuts, or scrapes. Scrub your back and under your arms. Make sure to wash skin folds. Let the lather sit on your skin for 1-2 minutes or as long as told by your health care provider. Using a different clean, wet washcloth, thoroughly rinse your entire body. Make sure that all body creases and crevices are rinsed well. Dry off with a clean towel. Do not put any substances on your body afterward--such as powder, lotion, or perfume--unless you are told to do so by your health care provider. Only use lotions that are recommended by the manufacturer. Put on clean clothes or pajamas. If it is the night before your surgery, sleep in clean sheets. How to use CHG prepackaged cloths Only use CHG cloths as told by your health care provider, and follow the instructions on the label. Use the CHG cloth on clean, dry skin. Do not use the CHG cloth on your head or face unless your health care provider tells you to. When washing with the CHG cloth: Avoid your genital area. Avoid any areas of skin that have broken skin, cuts, or  scrapes. Before surgery Follow these steps when using a CHG cloth to clean before surgery (unless your health care provider gives you different instructions): Using the CHG cloth, vigorously scrub the part of your body where you will be having surgery. Scrub using a back-and-forth motion for 3 minutes. The area on your body should be completely wet with CHG when you are done scrubbing. Do not rinse. Discard the cloth and let the area air-dry. Do not put any substances on the area afterward, such as powder, lotion, or perfume. Put on clean clothes or pajamas. If it is the night before your surgery, sleep in clean sheets.  For general  bathing Follow these steps when using CHG cloths for general bathing (unless your health care provider gives you different instructions). Use a separate CHG cloth for each area of your body. Make sure you wash between any folds of skin and between your fingers and toes. Wash your body in the following order, switching to a new cloth after each step: The front of your neck, shoulders, and chest. Both of your arms, under your arms, and your hands. Your stomach and groin area, avoiding the genitals. Your right leg and foot. Your left leg and foot. The back of your neck, your back, and your buttocks. Do not rinse. Discard the cloth and let the area air-dry. Do not put any substances on your body afterward--such as powder, lotion, or perfume--unless you are told to do so by your health care provider. Only use lotions that are recommended by the manufacturer. Put on clean clothes or pajamas. Contact a health care provider if: Your skin gets irritated after scrubbing. You have questions about using your solution or cloth. Get help right away if: Your eyes become very red or swollen. Your eyes itch badly. Your skin itches badly and is red or swollen. Your hearing changes. You have trouble seeing. You have swelling or tingling in your mouth or throat. You have trouble  breathing. You swallow any chlorhexidine. Summary Chlorhexidine gluconate (CHG) is a germ-killing (antiseptic) solution that is used to clean the skin. Cleaning your skin with CHG may help to lower your risk for infection. You may be given CHG to use for bathing. It may be in a bottle or in a prepackaged cloth to use on your skin. Carefully follow your health care provider's instructions and the instructions on the product label. Do not use CHG if you have a chlorhexidine allergy. Contact your health care provider if your skin gets irritated after scrubbing. This information is not intended to replace advice given to you by your health care provider. Make sure you discuss any questions you have with your healthcare provider. Document Revised: 03/20/2020 Document Reviewed: 04/24/2020 Elsevier Patient Education  [place]. Knee Arthroscopy, Care After This sheet gives you information about how to care for yourself after your procedure. Your health care provider may also give you more specific instructions. If you have problems or questions, contact your health careprovider. What can I expect after the procedure? After the procedure, it is common to have: Soreness. Swelling. Pain. A small amount of fluid from the incisions. Follow these instructions at home: Medicines Take over-the-counter and prescription medicines only as told by your health care provider. Ask your health care provider if the medicine prescribed to you: Requires you to avoid driving or using machinery. Can cause constipation. You may need to take these actions to prevent or treat constipation: Drink enough fluid to keep your urine pale yellow. Take over-the-counter or prescription medicines. Eat foods that are high in fiber, such as beans, whole grains, and fresh fruits and vegetables. Limit foods that are high in fat and processed sugars, such as fried or sweet foods. If you have a brace or immobilizer: Wear it  as told by your health care provider. Remove it only as told by your health care provider. Loosen it if your toes tingle, become numb, or turn cold and blue. Keep it clean and dry. Bathing Do not take baths, swim, or use a hot tub until your health care provider approves. Ask your health care provider if you may take showers. Keep your  bandage (dressing) dry until your health care provider says that it can be removed. If the brace or immobilizer is not waterproof: Do not let it get wet. Cover it with a watertight covering when you take a bath or shower. Incision care  Follow instructions from your health care provider about how to take care of your incisions. Make sure you: Wash your hands with soap and water for at least 20 seconds before and after you change your dressing. If soap and water are not available, use hand sanitizer. Change your dressing as told by your health care provider. Leave stitches (sutures) or adhesive strips in place. These skin closures may need to stay in place for 2 weeks or longer. If adhesive strip edges start to loosen and curl up, you may trim the loose edges. Do not remove adhesive strips completely unless your health care provider tells you to do that. Check your incision areas every day for signs of infection. Check for: Redness. More swelling or pain. Blood or more fluid. Warmth. Pus or a bad smell.  Managing pain, stiffness, and swelling  If directed, put ice on the injured area. To do this: If you have a removable brace or immobilizer, remove it as told by your health care provider. Put ice in a plastic bag or use the icing device (cold therapy unit) that you were given. Follow instructions about how to use the icing device. Place a towel between your skin and the bag or between your skin and the icing device. Leave the ice on for 20 minutes, 2-3 times a day. Remove the ice if your skin turns bright red. This is very important. If you cannot feel  pain, heat, or cold, you have a greater risk of damage to the area. Move your toes often to reduce stiffness and swelling. Raise (elevate) the injured area above the level of your heart while you are sitting or lying down.  Activity Do not use your knee to support your body weight until your health care provider says that you can. Follow weight-bearing restrictions as told. Use crutches or other devices to help you move around (assistive devices) as told by your health care provider. Ask your health care provider what activities are safe for you during recovery, and what activities you need to avoid. If physical therapy was prescribed, do exercises as told by your health care provider. Doing exercises may help improve knee movement, range of motion, and flexibility. Do not lift anything that is heavier than 10 lb (4.5 kg), or the limit that you are told, until your health care provider says that it is safe. General instructions Do not drive until your health care provider approves. You may be able to drive after 1-3 weeks. Do not use any products that contain nicotine or tobacco, such as cigarettes, e-cigarettes, and chewing tobacco. These can delay incision or bone healing after surgery. If you need help quitting, ask your health care provider. Wear compression stockings as told by your health care provider. These stockings help to prevent blood clots and reduce swelling in your legs. Keep all follow-up visits. This is important. Contact a health care provider if: You have any of these signs of infection: Redness or more pain around an incision. Blood or more fluid coming from an incision. Warmth coming from an incision. Pus or a bad smell coming from an incision. More swelling in your knee. A fever or chills. You have severe knee pain, and medicine does not help. An  incision opens up. Get help right away if: You have trouble breathing or shortness of breath. You have chest pain. You  develop pain or swelling in your lower leg or at the back of your knee. You have numbness or tingling in your lower leg or your foot. You notice that your foot or toes look darker than normal or are cooler than normal. These symptoms may represent a serious problem that is an emergency. Do not wait to see if the symptoms will go away. Get medical help right away. Call your local emergency services (911 in the [place] Do not drive yourself to the hospital. Summary To help relieve pain and swelling, put ice on the injured area for 20 minutes, 2-3 times a day. Raise (elevate) the injured area above the level of your heart while you are sitting or lying down. If physical therapy was prescribed, do exercises as told by your health care provider. Exercises may help improve range of motion. This information is not intended to replace advice given to you by your health care provider. Make sure you discuss any questions you have with your healthcare provider. Document Revised: 03/09/2020 Document Reviewed: 03/09/2020 Elsevier Patient Education  [place] Anesthesia, Adult, Care After This sheet gives you information about how to care for yourself after your procedure. Your health care provider may also give you more specific instructions. If you have problems or questions, contact your health careprovider. What can I expect after the procedure? After the procedure, the following side effects are common: Pain or discomfort at the IV site. Nausea. Vomiting. Sore throat. Trouble concentrating. Feeling cold or chills. Feeling weak or tired. Sleepiness and fatigue. Soreness and body aches. These side effects can affect parts of the body that were not involved in surgery. Follow these instructions at home: For the time period you were told by your health care provider:  Rest. Do not participate in activities where you could fall or become injured. Do not drive or use machinery. Do not  drink alcohol. Do not take sleeping pills or medicines that cause drowsiness. Do not make important decisions or sign legal documents. Do not take care of children on your own.  Eating and drinking Follow any instructions from your health care provider about eating or drinking restrictions. When you feel hungry, start by eating small amounts of foods that are soft and easy to digest (bland), such as toast. Gradually return to your regular diet. Drink enough fluid to keep your urine pale yellow. If you vomit, rehydrate by drinking water, juice, or clear broth. General instructions If you have sleep apnea, surgery and certain medicines can increase your risk for breathing problems. Follow instructions from your health care provider about wearing your sleep device: Anytime you are sleeping, including during daytime naps. While taking prescription pain medicines, sleeping medicines, or medicines that make you drowsy. Have a responsible adult stay with you for the time you are told. It is important to have someone help care for you until you are awake and alert. Return to your normal activities as told by your health care provider. Ask your health care provider what activities are safe for you. Take over-the-counter and prescription medicines only as told by your health care provider. If you smoke, do not smoke without supervision. Keep all follow-up visits as told by your health care provider. This is important. Contact a health care provider if: You have nausea or vomiting that does not get better with medicine. You cannot  eat or drink without vomiting. You have pain that does not get better with medicine. You are unable to pass urine. You develop a skin rash. You have a fever. You have redness around your IV site that gets worse. Get help right away if: You have difficulty breathing. You have chest pain. You have blood in your urine or stool, or you vomit blood. Summary After the  procedure, it is common to have a sore throat or nausea. It is also common to feel tired. Have a responsible adult stay with you for the time you are told. It is important to have someone help care for you until you are awake and alert. When you feel hungry, start by eating small amounts of foods that are soft and easy to digest (bland), such as toast. Gradually return to your regular diet. Drink enough fluid to keep your urine pale yellow. Return to your normal activities as told by your health care provider. Ask your health care provider what activities are safe for you. This information is not intended to replace advice given to you by your health care provider. Make sure you discuss any questions you have with your healthcare provider. Document Revised: 07/23/2020 Document Reviewed: 02/20/2020 Elsevier Patient Education  2022 [employer].

## 2021-05-17 ENCOUNTER — Ambulatory Visit (INDEPENDENT_AMBULATORY_CARE_PROVIDER_SITE_OTHER): Payer: Commercial Managed Care - PPO | Admitting: Orthopedic Surgery

## 2021-05-17 ENCOUNTER — Other Ambulatory Visit: Payer: Self-pay

## 2021-05-17 ENCOUNTER — Encounter: Payer: Self-pay | Admitting: Orthopedic Surgery

## 2021-05-17 VITALS — BP 178/97 | HR 84 | Ht 65.0 in | Wt 197.0 lb

## 2021-05-17 DIAGNOSIS — M5410 Radiculopathy, site unspecified: Secondary | ICD-10-CM

## 2021-05-17 MED ORDER — CYCLOBENZAPRINE HCL 5 MG PO TABS: 5.0000 mg | ORAL_TABLET | Freq: Three times a day (TID) | ORAL | 0 refills | Status: DC | PRN

## 2021-05-17 NOTE — Progress Notes (Signed)
FOLLOW UP   Encounter Diagnosis  Name Primary?   Radicular pain of left lower extremity Yes     Chief Complaint  Patient presents with   Knee Pain   Leg Pain   Back Pain     Andrea Gregory is scheduled for surgery on her left knee but she had an acute attack of the issues related to her lower back problems.  We treated her with a steroid Dosepak increased her dose of Neurontin and she says she has made some significant improvements although she still been having some lower back pain  She is tender on the lower back radiates to the left leg but she does not have any motor weakness  Recommend continue the medication as ordered with Neurontin and Sterapred Dosepak add Flexeril 5 mg in the evening and right before bed hopefully no more problems before surgery on Friday and then a follow-up postop visit scheduled for July 6   Meds ordered this encounter  Medications   cyclobenzaprine (FLEXERIL) 5 MG tablet    Sig: Take 1 tablet (5 mg total) by mouth 3 (three) times daily as needed for muscle spasms.    Dispense:  30 tablet    Refill:  0

## 2021-05-19 ENCOUNTER — Encounter (HOSPITAL_COMMUNITY)
Admission: RE | Admit: 2021-05-19 | Discharge: 2021-05-19 | Disposition: A | Discharge status: Home / Self Care | Payer: Commercial Managed Care - PPO | Source: Ambulatory Visit | Attending: Orthopedic Surgery | Admitting: Orthopedic Surgery

## 2021-05-19 ENCOUNTER — Encounter (HOSPITAL_COMMUNITY): Payer: Self-pay

## 2021-05-19 ENCOUNTER — Other Ambulatory Visit: Payer: Self-pay

## 2021-05-19 ENCOUNTER — Other Ambulatory Visit: Payer: Self-pay | Admitting: Orthopedic Surgery

## 2021-05-19 DIAGNOSIS — Z01812 Encounter for preprocedural laboratory examination: Secondary | ICD-10-CM | POA: Diagnosis present

## 2021-05-19 DIAGNOSIS — M23322 Other meniscus derangements, posterior horn of medial meniscus, left knee: Secondary | ICD-10-CM

## 2021-05-19 HISTORY — DX: Unspecified osteoarthritis, unspecified site: M19.90

## 2021-05-19 LAB — BASIC METABOLIC PANEL
Anion gap: 9 (ref 5–15)
BUN: 22 mg/dL (ref 8–23)
CO2: 24 mmol/L (ref 22–32)
Calcium: 9.2 mg/dL (ref 8.9–10.3)
Chloride: 104 mmol/L (ref 98–111)
Creatinine, Ser: 0.85 mg/dL (ref 0.44–1.00)
GFR, Estimated: >60 mL/min (ref >60)
Glucose, Bld: 116 mg/dL — ABNORMAL HIGH (ref 70–99)
Potassium: 4.3 mmol/L (ref 3.5–5.1)
Sodium: 137 mmol/L (ref 135–145)

## 2021-05-19 LAB — CBC WITH DIFFERENTIAL/PLATELET
Abs Immature Granulocytes: 0.21 10*3/uL — ABNORMAL HIGH (ref 0.00–0.07)
Basophils Absolute: 0 10*3/uL (ref 0.0–0.1)
Basophils Relative: 0 %
Eosinophils Absolute: 0 10*3/uL (ref 0.0–0.5)
Eosinophils Relative: 0 %
HCT: 40.1 % (ref 36.0–46.0)
Hemoglobin: 12.7 g/dL (ref 12.0–15.0)
Immature Granulocytes: 2 %
Lymphocytes Relative: 11 %
Lymphs Abs: 1.3 10*3/uL (ref 0.7–4.0)
MCH: 25.7 pg — ABNORMAL LOW (ref 26.0–34.0)
MCHC: 31.7 g/dL (ref 30.0–36.0)
MCV: 81 fL (ref 80.0–100.0)
Monocytes Absolute: 0.9 10*3/uL (ref 0.1–1.0)
Monocytes Relative: 8 %
Neutro Abs: 8.9 10*3/uL — ABNORMAL HIGH (ref 1.7–7.7)
Neutrophils Relative %: 79 %
Platelets: 397 10*3/uL (ref 150–400)
RBC: 4.95 MIL/uL (ref 3.87–5.11)
RDW: 16.7 % — ABNORMAL HIGH (ref 11.5–15.5)
WBC: 11.3 10*3/uL — ABNORMAL HIGH (ref 4.0–10.5)
nRBC: 0 % (ref 0.0–0.2)

## 2021-05-20 NOTE — H&P (Signed)
Admission history and physical for outpatient surgery   Chief Complaint  Patient presents with   Knee Pain      Left knee painful long duration worse recently       65 year old female previously seen in our office for painful left knee took ibuprofen had an injection did not improve  The knee pain is primarily over the medial joint line her pain is associated with giving way   No Known Allergies  Past Medical History:  Diagnosis Date   Aortic atherosclerosis ([place])    a. seen on CT 04/2017.   Arthritis    Depression    GERD (gastroesophageal reflux disease)    Hyperlipidemia    INSOMNIA, CHRONIC 08/25/2009   MIGRAINE HEADACHE 08/25/2009   Seasonal allergies    SPRAIN AND STRAIN OF UNSPECIFIED SITE OF HAND 03/31/2010   pt states that she doesnt have this hx   Past Surgical History:  Procedure Laterality Date   ABDOMINAL HYSTERECTOMY  11/21/1982   APPENDECTOMY  11/21/2000   BLADDER SUSPENSION  11/22/2003   BLADDER SUSPENSION     COLONOSCOPY WITH ESOPHAGOGASTRODUODENOSCOPY (EGD) N/A 03/28/2013   Procedure: COLONOSCOPY WITH ESOPHAGOGASTRODUODENOSCOPY (EGD);  Surgeon: Daneil Dolin, MD;  Location: [facility];  Service: Endoscopy;  Laterality: N/A;  8:30-moved to [place] to notify pt   DILATION AND CURETTAGE OF UTERUS  11/21/1981   exploratory laprotomy  11/21/1992   MALONEY DILATION N/A 03/28/2013   Procedure: Venia Minks DILATION;  Surgeon: Daneil Dolin, MD;  Location: [facility];  Service: Endoscopy;  Laterality: N/A;   miscarriage  11/21/1976   SAVORY DILATION N/A 03/28/2013   Procedure: SAVORY DILATION;  Surgeon: Daneil Dolin, MD;  Location: [facility];  Service: Endoscopy;  Laterality: N/A;   TONSILLECTOMY     Social History   Tobacco Use   Smoking status: Former    Packs/day: 0.25    Years: 35.00    Pack years: 8.75    Types: Cigarettes    Quit date: 04/12/2015    Years since quitting: 6.1   Smokeless tobacco: Never  Substance Use Topics    Alcohol use: No    Comment: occ   Drug use: No   Family History  Problem Relation Age of Onset   Diabetes Other    Hypertension Other    Stroke Other    Miscarriages / Stillbirths Other    Alcohol abuse Other    Mental illness Other    Ovarian cancer Sister        ovarian   Colon cancer Paternal Grandmother        age >34   Breast cancer Paternal Aunt        two   Angina Mother 47   Lung cancer Neg Hx       Examination shows a well-developed well-nourished female awake alert and oriented x3 tenderness along the medial joint line and peripatellar region tenderness lower back negative straight leg raise decree sensation medial aspect of left leg strength is normal   Knee exam is notable for positive McMurray's sign along with the medial joint line tenderness ligaments feel stable muscle tone is normal skin is intact small effusion noted   Plan  The procedure has been fully reviewed with the patient; The risks and benefits of surgery have been discussed and explained and understood. Alternative treatment has also been reviewed, questions were encouraged and answered. The postoperative plan is also been reviewed.    Arthroscopy left knee  partial medial meniscectomy

## 2021-05-21 ENCOUNTER — Ambulatory Visit (HOSPITAL_COMMUNITY): Payer: Commercial Managed Care - PPO | Admitting: Anesthesiology

## 2021-05-21 ENCOUNTER — Other Ambulatory Visit: Payer: Self-pay

## 2021-05-21 ENCOUNTER — Encounter (HOSPITAL_COMMUNITY)
Admission: RE | Disposition: A | Discharge status: Home / Self Care | Payer: Self-pay | Source: Home / Self Care | Attending: Orthopedic Surgery

## 2021-05-21 ENCOUNTER — Encounter (HOSPITAL_COMMUNITY): Payer: Self-pay | Admitting: Orthopedic Surgery

## 2021-05-21 ENCOUNTER — Ambulatory Visit (HOSPITAL_COMMUNITY)
Admission: RE | Admit: 2021-05-21 | Discharge: 2021-05-21 | Disposition: A | Discharge status: Home / Self Care | Payer: Commercial Managed Care - PPO | Attending: Orthopedic Surgery | Admitting: Orthopedic Surgery

## 2021-05-21 DIAGNOSIS — Z87891 Personal history of nicotine dependence: Secondary | ICD-10-CM | POA: Diagnosis not present

## 2021-05-21 DIAGNOSIS — X58XXXA Exposure to other specified factors, initial encounter: Secondary | ICD-10-CM | POA: Insufficient documentation

## 2021-05-21 DIAGNOSIS — Z90710 Acquired absence of both cervix and uterus: Secondary | ICD-10-CM | POA: Insufficient documentation

## 2021-05-21 DIAGNOSIS — S83242D Other tear of medial meniscus, current injury, left knee, subsequent encounter: Secondary | ICD-10-CM | POA: Diagnosis not present

## 2021-05-21 DIAGNOSIS — S83242A Other tear of medial meniscus, current injury, left knee, initial encounter: Secondary | ICD-10-CM

## 2021-05-21 HISTORY — PX: KNEE ARTHROSCOPY WITH MEDIAL MENISECTOMY: SHX5651

## 2021-05-21 SURGERY — ARTHROSCOPY, KNEE, WITH MEDIAL MENISCECTOMY
Anesthesia: General | Site: Knee | Laterality: Left

## 2021-05-21 MED ORDER — PROMETHAZINE HCL 12.5 MG PO TABS: 12.5000 mg | ORAL_TABLET | Freq: Four times a day (QID) | ORAL | 0 refills | Status: DC | PRN

## 2021-05-21 MED ORDER — LIDOCAINE 2% (20 MG/ML) 5 ML SYRINGE
INTRAMUSCULAR | Status: DC | PRN
  Administered 2021-05-21: 100 mg via INTRAVENOUS

## 2021-05-21 MED ORDER — ORAL CARE MOUTH RINSE: 15.0000 mL | Freq: Once | OROMUCOSAL | Status: AC

## 2021-05-21 MED ORDER — PROPOFOL 10 MG/ML IV BOLUS
INTRAVENOUS | Status: AC
  Filled 2021-05-21: qty 20

## 2021-05-21 MED ORDER — ONDANSETRON HCL 4 MG/2ML IJ SOLN
INTRAMUSCULAR | Status: AC
  Filled 2021-05-21: qty 2

## 2021-05-21 MED ORDER — MIDAZOLAM HCL 2 MG/2ML IJ SOLN
INTRAMUSCULAR | Status: AC
  Filled 2021-05-21: qty 2

## 2021-05-21 MED ORDER — GLYCOPYRROLATE PF 0.2 MG/ML IJ SOSY
PREFILLED_SYRINGE | INTRAMUSCULAR | Status: AC
  Filled 2021-05-21: qty 1

## 2021-05-21 MED ORDER — BUPIVACAINE-EPINEPHRINE (PF) 0.5% -1:200000 IJ SOLN
INTRAMUSCULAR | Status: AC
  Filled 2021-05-21: qty 60

## 2021-05-21 MED ORDER — LABETALOL HCL 5 MG/ML IV SOLN
5.0000 mg | Freq: Once | INTRAVENOUS | Status: AC
  Administered 2021-05-21: 5 mg via INTRAVENOUS

## 2021-05-21 MED ORDER — HYDROCODONE-ACETAMINOPHEN 5-325 MG PO TABS
1.0000 | ORAL_TABLET | Freq: Once | ORAL | Status: AC
  Administered 2021-05-21: 1 via ORAL

## 2021-05-21 MED ORDER — MEPERIDINE HCL 50 MG/ML IJ SOLN: 6.2500 mg | INTRAMUSCULAR | Status: DC | PRN

## 2021-05-21 MED ORDER — PROPOFOL 10 MG/ML IV BOLUS
INTRAVENOUS | Status: DC | PRN
  Administered 2021-05-21: 150 mg via INTRAVENOUS

## 2021-05-21 MED ORDER — CEFAZOLIN SODIUM-DEXTROSE 2-4 GM/100ML-% IV SOLN
2.0000 g | INTRAVENOUS | Status: AC
  Administered 2021-05-21: 2 g via INTRAVENOUS

## 2021-05-21 MED ORDER — HYDROCODONE-ACETAMINOPHEN 5-325 MG PO TABS
ORAL_TABLET | ORAL | Status: AC
  Filled 2021-05-21: qty 1

## 2021-05-21 MED ORDER — GLYCOPYRROLATE PF 0.2 MG/ML IJ SOSY
PREFILLED_SYRINGE | INTRAMUSCULAR | Status: DC | PRN
  Administered 2021-05-21 (×2): .1 mg via INTRAVENOUS

## 2021-05-21 MED ORDER — ONDANSETRON HCL 4 MG/2ML IJ SOLN
INTRAMUSCULAR | Status: DC | PRN
  Administered 2021-05-21: 4 mg via INTRAVENOUS

## 2021-05-21 MED ORDER — SODIUM CHLORIDE 0.9 % IR SOLN
Status: DC | PRN
  Administered 2021-05-21 (×2): 3000 mL

## 2021-05-21 MED ORDER — LIDOCAINE HCL (PF) 2 % IJ SOLN
INTRAMUSCULAR | Status: AC
  Filled 2021-05-21: qty 5

## 2021-05-21 MED ORDER — ONDANSETRON HCL 4 MG/2ML IJ SOLN
4.0000 mg | Freq: Once | INTRAMUSCULAR | Status: AC
  Administered 2021-05-21: 4 mg via INTRAVENOUS

## 2021-05-21 MED ORDER — ONDANSETRON HCL 4 MG/2ML IJ SOLN: 4.0000 mg | Freq: Once | INTRAMUSCULAR | Status: DC | PRN

## 2021-05-21 MED ORDER — HYDROCODONE-ACETAMINOPHEN 7.5-325 MG PO TABS: 1.0000 | ORAL_TABLET | ORAL | 0 refills | Status: AC | PRN

## 2021-05-21 MED ORDER — CEFAZOLIN SODIUM-DEXTROSE 2-4 GM/100ML-% IV SOLN
INTRAVENOUS | Status: AC
  Filled 2021-05-21: qty 100

## 2021-05-21 MED ORDER — LABETALOL HCL 5 MG/ML IV SOLN
INTRAVENOUS | Status: AC
  Filled 2021-05-21: qty 4

## 2021-05-21 MED ORDER — LACTATED RINGERS IV SOLN: INTRAVENOUS | Status: DC

## 2021-05-21 MED ORDER — KETOROLAC TROMETHAMINE 30 MG/ML IJ SOLN
INTRAMUSCULAR | Status: AC
  Filled 2021-05-21: qty 1

## 2021-05-21 MED ORDER — DEXAMETHASONE SODIUM PHOSPHATE 4 MG/ML IJ SOLN
INTRAMUSCULAR | Status: DC | PRN
  Administered 2021-05-21: 10 mg via INTRAVENOUS

## 2021-05-21 MED ORDER — SODIUM CHLORIDE 0.9 % IR SOLN
Status: DC | PRN
  Administered 2021-05-21: 1000 mL

## 2021-05-21 MED ORDER — CHLORHEXIDINE GLUCONATE 0.12 % MT SOLN
15.0000 mL | Freq: Once | OROMUCOSAL | Status: AC
  Administered 2021-05-21: 15 mL via OROMUCOSAL

## 2021-05-21 MED ORDER — HYDROMORPHONE HCL 1 MG/ML IJ SOLN: 0.2500 mg | INTRAMUSCULAR | Status: DC | PRN

## 2021-05-21 MED ORDER — DEXAMETHASONE SODIUM PHOSPHATE 10 MG/ML IJ SOLN
INTRAMUSCULAR | Status: AC
  Filled 2021-05-21: qty 1

## 2021-05-21 MED ORDER — KETOROLAC TROMETHAMINE 30 MG/ML IJ SOLN
INTRAMUSCULAR | Status: DC | PRN
  Administered 2021-05-21: 30 mg via INTRAVENOUS

## 2021-05-21 MED ORDER — FENTANYL CITRATE (PF) 100 MCG/2ML IJ SOLN
INTRAMUSCULAR | Status: DC | PRN
  Administered 2021-05-21: 50 ug via INTRAVENOUS
  Administered 2021-05-21: 25 ug via INTRAVENOUS

## 2021-05-21 MED ORDER — FENTANYL CITRATE (PF) 250 MCG/5ML IJ SOLN
INTRAMUSCULAR | Status: AC
  Filled 2021-05-21: qty 5

## 2021-05-21 MED ORDER — EPINEPHRINE PF 1 MG/ML IJ SOLN
INTRAMUSCULAR | Status: AC
  Filled 2021-05-21: qty 5

## 2021-05-21 MED ORDER — MIDAZOLAM HCL 5 MG/5ML IJ SOLN
INTRAMUSCULAR | Status: DC | PRN
  Administered 2021-05-21: 2 mg via INTRAVENOUS

## 2021-05-21 MED ORDER — BUPIVACAINE-EPINEPHRINE (PF) 0.5% -1:200000 IJ SOLN
INTRAMUSCULAR | Status: DC | PRN
  Administered 2021-05-21: 10 mL
  Administered 2021-05-21: 20 mL

## 2021-05-21 SURGICAL SUPPLY — 51 items
APL PRP STRL LF DISP 70% ISPRP (MISCELLANEOUS) ×1
BAG HAMPER (MISCELLANEOUS) ×3 IMPLANT
BANDAGE ELASTIC 6 VELCRO NS (GAUZE/BANDAGES/DRESSINGS) ×2 IMPLANT
BLADE SURG SZ11 CARB STEEL (BLADE) ×3 IMPLANT
BNDG CMPR STD VLCR NS LF 5.8X6 (GAUZE/BANDAGES/DRESSINGS) ×1
BNDG ELASTIC 6X5.8 VLCR NS LF (GAUZE/BANDAGES/DRESSINGS) ×3 IMPLANT
CHLORAPREP W/TINT 26 (MISCELLANEOUS) ×3 IMPLANT
CLOTH BEACON ORANGE TIMEOUT ST (SAFETY) ×3 IMPLANT
COOLER ICEMAN CLASSIC (MISCELLANEOUS) ×3 IMPLANT
COVER WAND RF STERILE (DRAPES) ×6 IMPLANT
DECANTER SPIKE VIAL GLASS SM (MISCELLANEOUS) ×6 IMPLANT
DISSECTOR 4.0MM X 13CM (MISCELLANEOUS) ×2 IMPLANT
DRAPE HALF SHEET 40X57 (DRAPES) ×3 IMPLANT
GAUZE 4X4 16PLY ~~LOC~~+RFID DBL (SPONGE) ×3 IMPLANT
GAUZE SPONGE 4X4 12PLY STRL (GAUZE/BANDAGES/DRESSINGS) ×3 IMPLANT
GAUZE SPONGE 4X4 16PLY XRAY LF (GAUZE/BANDAGES/DRESSINGS) ×3 IMPLANT
GAUZE XEROFORM 5X9 LF (GAUZE/BANDAGES/DRESSINGS) ×3 IMPLANT
GLOVE SKINSENSE NS SZ8.0 LF (GLOVE) ×2
GLOVE SKINSENSE STRL SZ8.0 LF (GLOVE) ×1 IMPLANT
GLOVE SS N UNI LF 8.5 STRL (GLOVE) ×3 IMPLANT
GLOVE SURG UNDER POLY LF SZ7 (GLOVE) ×6 IMPLANT
GOWN STRL REUS W/TWL LRG LVL3 (GOWN DISPOSABLE) ×3 IMPLANT
GOWN STRL REUS W/TWL XL LVL3 (GOWN DISPOSABLE) ×3 IMPLANT
IV NS IRRIG 3000ML ARTHROMATIC (IV SOLUTION) ×6 IMPLANT
KIT BLADEGUARD II DBL (SET/KITS/TRAYS/PACK) ×3 IMPLANT
KIT TURNOVER CYSTO (KITS) ×3 IMPLANT
MANIFOLD NEPTUNE II (INSTRUMENTS) ×3 IMPLANT
MARKER SKIN DUAL TIP RULER LAB (MISCELLANEOUS) ×3 IMPLANT
NDL HYPO 18GX1.5 BLUNT FILL (NEEDLE) ×1 IMPLANT
NDL HYPO 21X1.5 SAFETY (NEEDLE) ×1 IMPLANT
NDL SPNL 18GX3.5 QUINCKE PK (NEEDLE) ×1 IMPLANT
NEEDLE HYPO 18GX1.5 BLUNT FILL (NEEDLE) ×3 IMPLANT
NEEDLE HYPO 21X1.5 SAFETY (NEEDLE) ×3 IMPLANT
NEEDLE SPNL 18GX3.5 QUINCKE PK (NEEDLE) ×3 IMPLANT
NS IRRIG 1000ML POUR BTL (IV SOLUTION) ×3 IMPLANT
PACK ARTHRO LIMB DRAPE STRL (MISCELLANEOUS) ×3 IMPLANT
PAD ABD 5X9 TENDERSORB (GAUZE/BANDAGES/DRESSINGS) ×3 IMPLANT
PAD ARMBOARD 7.5X6 YLW CONV (MISCELLANEOUS) ×3 IMPLANT
PAD COLD SHLDR SM WRAP-ON (PAD) ×2 IMPLANT
PAD FOR LEG HOLDER (MISCELLANEOUS) ×3 IMPLANT
PADDING CAST COTTON 6X4 STRL (CAST SUPPLIES) ×3 IMPLANT
PORT APPOLLO RF 90DEGREE MULTI (SURGICAL WAND) ×2 IMPLANT
SET ARTHROSCOPY INST (INSTRUMENTS) ×3 IMPLANT
SET BASIN LINEN APH (SET/KITS/TRAYS/PACK) ×3 IMPLANT
SPONGE GAUZE 4X4 12PLY (GAUZE/BANDAGES/DRESSINGS) ×2 IMPLANT
SUT ETHILON 3 0 FSL (SUTURE) ×3 IMPLANT
SYR 10ML LL (SYRINGE) ×3 IMPLANT
SYR 30ML LL (SYRINGE) ×3 IMPLANT
TUBE CONNECTING 12'X1/4 (SUCTIONS) ×2
TUBE CONNECTING 12X1/4 (SUCTIONS) ×4 IMPLANT
TUBING IN/OUT FLOW W/MAIN PUMP (TUBING) ×3 IMPLANT

## 2021-05-21 NOTE — Transfer of Care (Signed)
Immediate Anesthesia Transfer of Care Note  Patient: Andrea Gregory  Procedure(s) Performed: LEFT KNEE ARTHROSCOPY WITH MEDIAL MENISCECTOMY (Left: Knee)  Patient Location: PACU  Anesthesia Type:General  Level of Consciousness: awake, alert  and oriented  Airway & Oxygen Therapy: Patient Spontanous Breathing and Patient connected to face mask oxygen  Post-op Assessment: Report given to RN and Post -op Vital signs reviewed and stable  Post vital signs: Reviewed and stable  Last Vitals:  Vitals Value Taken Time  BP 193/131 05/21/21 1000  Temp    Pulse 68 05/21/21 1000  Resp 10 05/21/21 1000  SpO2 98 % 05/21/21 1000  Vitals shown include unvalidated device data.  Last Pain:  Vitals:   05/21/21 0749  TempSrc: Oral  PainSc: 0-No pain         Complications: No notable events documented.

## 2021-05-21 NOTE — Brief Op Note (Signed)
05/21/2021  9:57 AM  PATIENT:  Andrea Gregory  65 y.o. female  PRE-OPERATIVE DIAGNOSIS:  Torn medial meniscus left knee  POST-OPERATIVE DIAGNOSIS: Torn medial meniscus left knee  PROCEDURE:  Procedure(s): LEFT KNEE ARTHROSCOPY WITH MEDIAL MENISCECTOMY (Left)  Surgical findings torn medial meniscus the meniscal tear was in the anterior horn mid body junction which was a radial tear and then there was an undersurface incomplete tear of the posterior horn  Mild degenerative changes of the medial femoral condyle and patellofemoral joint lateral side normal  ACL PCL intact SURGEON:  Surgeon(s) and Role:    * Carole Civil, MD - Primary  PHYSICIAN ASSISTANT:   ASSISTANTS: none   ANESTHESIA:   general  EBL:  none   BLOOD ADMINISTERED:none  DRAINS: none   LOCAL MEDICATIONS USED:  MARCAINE     SPECIMEN:  No Specimen  DISPOSITION OF SPECIMEN:  N/A  COUNTS:  YES  TOURNIQUET:  * No tourniquets in log *  DICTATION: .Dragon Dictation  PLAN OF CARE: Discharge to home after PACU  PATIENT DISPOSITION:  PACU - hemodynamically stable.   Delay start of Pharmacological VTE agent (>24hrs) due to surgical blood loss or risk of bleeding: not applicable

## 2021-05-21 NOTE — Anesthesia Postprocedure Evaluation (Signed)
Anesthesia Post Note  Patient: Andrea Gregory  Procedure(s) Performed: LEFT KNEE ARTHROSCOPY WITH MEDIAL MENISCECTOMY (Left: Knee)  Patient location during evaluation: PACU Anesthesia Type: General Level of consciousness: awake and alert and oriented Pain management: pain level controlled Respiratory status: spontaneous breathing and respiratory function stable Cardiovascular status: blood pressure returned to baseline and stable Postop Assessment: no apparent nausea or vomiting Anesthetic complications: no   No notable events documented.   Last Vitals:  Vitals:   05/21/21 1045 05/21/21 1059  BP: (!) 187/98 (!) 186/91  Pulse: 65 67  Resp: 14 18  Temp:  (!) 36.4 C  SpO2: 96% 97%    Last Pain:  Vitals:   05/21/21 1059  TempSrc: Oral  PainSc: 0-No pain                 Mindy Gali C [place] Luther

## 2021-05-21 NOTE — Anesthesia Preprocedure Evaluation (Signed)
Anesthesia Evaluation  Patient identified by MRN, date of birth, ID band Patient awake    Reviewed: Allergy & Precautions, NPO status , Patient's Chart, lab work & pertinent test results  History of Anesthesia Complications Negative for: history of anesthetic complications  Airway Mallampati: III  TM Distance: >3 FB Neck ROM: Full    Dental  (+) Dental Advisory Given, Teeth Intact   Pulmonary former smoker,    Pulmonary exam normal breath sounds clear to auscultation       Cardiovascular Exercise Tolerance: Good Normal cardiovascular exam Rhythm:Regular Rate:Normal     Neuro/Psych  Headaches, PSYCHIATRIC DISORDERS Anxiety Depression    GI/Hepatic Neg liver ROS, GERD  Medicated and Controlled,  Endo/Other  negative endocrine ROS  Renal/GU negative Renal ROS     Musculoskeletal  (+) Arthritis , Osteoarthritis,    Abdominal   Peds  Hematology negative hematology ROS (+)   Anesthesia Other Findings On prednisone   Reproductive/Obstetrics negative OB ROS                            Anesthesia Physical Anesthesia Plan  ASA: 2  Anesthesia Plan: General   Post-op Pain Management:    Induction: Intravenous  PONV Risk Score and Plan: 3 and Ondansetron and Dexamethasone  Airway Management Planned: LMA  Additional Equipment:   Intra-op Plan:   Post-operative Plan: Extubation in OR  Informed Consent: I have reviewed the patients History and Physical, chart, labs and discussed the procedure including the risks, benefits and alternatives for the proposed anesthesia with the patient or authorized representative who has indicated his/her understanding and acceptance.     Dental advisory given  Plan Discussed with: CRNA and Surgeon  Anesthesia Plan Comments:         Anesthesia Quick Evaluation

## 2021-05-21 NOTE — Interval H&P Note (Signed)
History and Physical Interval Note:  05/21/2021 8:38 AM  Andrea Gregory  has presented today for surgery, with the diagnosis of Torn medial meniscus left knee.  The various methods of treatment have been discussed with the patient and family. After consideration of risks, benefits and other options for treatment, the patient has consented to  Procedure(s): KNEE ARTHROSCOPY WITH MEDIAL MENISCECTOMY (Left) as a surgical intervention.  The patient's history has been reviewed, patient examined, no change in status, stable for surgery.  I have reviewed the patient's chart and labs.  Questions were answered to the patient's satisfaction.     Arther Abbott

## 2021-05-21 NOTE — Anesthesia Procedure Notes (Signed)
Procedure Name: LMA Insertion Date/Time: 05/21/2021 8:58 AM Performed by: Orlie Dakin, CRNA Pre-anesthesia Checklist: Patient identified, Emergency Drugs available, Suction available and Patient being monitored Patient Re-evaluated:Patient Re-evaluated prior to induction Oxygen Delivery Method: Circle system utilized Preoxygenation: Pre-oxygenation with 100% oxygen Induction Type: IV induction LMA: LMA inserted LMA Size: 4.0 Tube type: Oral Number of attempts: 1 Placement Confirmation: positive ETCO2 Tube secured with: Tape Dental Injury: Teeth and Oropharynx as per pre-operative assessment

## 2021-05-21 NOTE — Op Note (Signed)
05/21/2021  9:59 AM  Knee arthroscopy dictation  Preop diagnosis torn medial meniscus left knee  Postop diagnosis torn medial meniscus left knee  Procedure arthroscopy arthroscopy partial medial meniscectomy  Surgeon Aline Brochure  Operative findings  torn medial meniscus at the body anterior horn junction radial tear, posterior horn undersurface incomplete tear.  ACL PCL intact.  Mild chondral changes medial femoral condyle and patellofemoral joint lateral side normal    The patient was identified in the preoperative holding area using 2 approved identification mechanisms. The chart was reviewed and updated. The surgical site was confirmed as left knee and marked with an indelible marker.  The patient was taken to the operating room for anesthesia. After successful general anesthesia, Ancef 2 g was used as IV antibiotics.  The patient was placed in the supine position with the (left lower extremity) the operative extremity in an arthroscopic leg holder and the opposite extremity in a padded leg holder.  The timeout was executed.  A lateral portal was established with an 11 blade and the scope was introduced into the joint. A diagnostic arthroscopy was performed in circumferential manner examining the entire knee joint. A medial portal was established and the diagnostic arthroscopy was repeated using a probe to palpate intra-articular structures as they were encountered.    The medial meniscus radial portion of the tear was resected with a straight biting forceps and then the meniscal fragments were removed the undersurface tear of the posterior horn was debrided with a shaver the scope was then placed into the medial portal and instruments were placed lateral to anterior with a side-biting biter to finish the resection of the torn portion of the anterior horn which was then balanced with a shaver.  We also used a 90 degree electrocautery device to control synovial bleeding     The  arthroscopic pump was placed on the wash mode and any excess debris was removed from the joint using suction.  60 cc of Marcaine with epinephrine was injected through the arthroscope.  The portals were closed with 3-0 nylon suture.  A sterile bandage, Ace wrap and Cryo/Cuff was placed and the Cryo/Cuff was activated. The patient was taken to the recovery room in stable condition.  PHYSICIAN ASSISTANT: no  ASSISTANTS: none   ANESTHESIA:   General  EBL:  none   BLOOD ADMINISTERED:none  DRAINS: none   LOCAL MEDICATIONS USED:  MARCAINE     SPECIMEN:  No Specimen  DISPOSITION OF SPECIMEN:  N/A  COUNTS:  YES   DICTATION: .Dragon Dictation  PLAN OF CARE: Discharge to home after PACU  PATIENT DISPOSITION:  PACU - hemodynamically stable.   Delay start of Pharmacological VTE agent (>24hrs) due to surgical blood loss or risk of bleeding: not applicable

## 2021-05-25 ENCOUNTER — Encounter (HOSPITAL_COMMUNITY): Payer: Self-pay | Admitting: Orthopedic Surgery

## 2021-05-26 ENCOUNTER — Ambulatory Visit (INDEPENDENT_AMBULATORY_CARE_PROVIDER_SITE_OTHER): Payer: Commercial Managed Care - PPO | Admitting: Orthopedic Surgery

## 2021-05-26 ENCOUNTER — Other Ambulatory Visit: Payer: Self-pay

## 2021-05-26 ENCOUNTER — Encounter: Payer: Self-pay | Admitting: Orthopedic Surgery

## 2021-05-26 VITALS — Ht 65.0 in | Wt 197.0 lb

## 2021-05-26 DIAGNOSIS — Z9889 Other specified postprocedural states: Secondary | ICD-10-CM

## 2021-05-26 NOTE — Patient Instructions (Signed)
Ice ice ice   Lose the walker when you feel comfortable   Steps are ok

## 2021-05-26 NOTE — Progress Notes (Signed)
POST OP   Chief Complaint  Patient presents with   Routine Post Op    S/p partial medial meniscectomy DOS 05/21/21    Encounter Diagnosis  Name Primary?   S/P arthroscopy of left knee May 21, 2021 Yes    PROCEDURE: Arthroscopy partial medial meniscectomy left knee   POV #1, postop day 5  Meds related: Norco 7.5 mg every 4 x5 days  Typical joint swelling patient is ambulatory weightbearing with walker sutures were removed knee bends are resulting in about 85 degrees flexion  Recommend continuing with ice adding quadricep strengthening follow-up in 3 weeks May return to work at her leisure and driving is okay

## 2021-05-27 ENCOUNTER — Encounter: Payer: Commercial Managed Care - PPO | Admitting: Orthopedic Surgery

## 2021-06-02 ENCOUNTER — Other Ambulatory Visit (HOSPITAL_COMMUNITY): Payer: Self-pay | Admitting: Adult Health

## 2021-06-02 ENCOUNTER — Other Ambulatory Visit: Payer: Self-pay

## 2021-06-02 ENCOUNTER — Ambulatory Visit (HOSPITAL_COMMUNITY)
Admission: RE | Admit: 2021-06-02 | Discharge: 2021-06-02 | Disposition: A | Discharge status: Home / Self Care | Payer: Commercial Managed Care - PPO | Source: Ambulatory Visit | Attending: Adult Health | Admitting: Adult Health

## 2021-06-02 DIAGNOSIS — R059 Cough, unspecified: Secondary | ICD-10-CM | POA: Diagnosis not present

## 2021-06-17 ENCOUNTER — Other Ambulatory Visit: Payer: Self-pay

## 2021-06-17 ENCOUNTER — Ambulatory Visit (INDEPENDENT_AMBULATORY_CARE_PROVIDER_SITE_OTHER): Payer: Commercial Managed Care - PPO | Admitting: Orthopedic Surgery

## 2021-06-17 DIAGNOSIS — M23322 Other meniscus derangements, posterior horn of medial meniscus, left knee: Secondary | ICD-10-CM

## 2021-06-17 DIAGNOSIS — Z9889 Other specified postprocedural states: Secondary | ICD-10-CM

## 2021-06-17 NOTE — Patient Instructions (Signed)
Gradual return to normal activity

## 2021-06-17 NOTE — Progress Notes (Signed)
POST OP   Chief Complaint  Patient presents with   Routine Post Op    Follow up/DOS 05/21/21    Encounter Diagnoses  Name Primary?   S/P arthroscopy of left knee May 21, 2021 Yes   Derangement of posterior horn of medial meniscus of left knee     PROCEDURE: arthroscopy left knee mm  POV # 2, POD 27  Meds related: none   Doing well, its great !  From no limp no effusion  Good quad control   Released   Note: sciatica is better

## 2021-08-11 ENCOUNTER — Ambulatory Visit: Payer: Commercial Managed Care - PPO | Admitting: Orthopedic Surgery

## 2021-08-12 ENCOUNTER — Ambulatory Visit: Payer: Commercial Managed Care - PPO

## 2021-08-12 ENCOUNTER — Other Ambulatory Visit: Payer: Self-pay

## 2021-08-12 ENCOUNTER — Ambulatory Visit (INDEPENDENT_AMBULATORY_CARE_PROVIDER_SITE_OTHER): Payer: Commercial Managed Care - PPO | Admitting: Orthopedic Surgery

## 2021-08-12 DIAGNOSIS — M25532 Pain in left wrist: Secondary | ICD-10-CM | POA: Diagnosis not present

## 2021-08-12 NOTE — Progress Notes (Signed)
Chief Complaint  Patient presents with   Wrist Injury    L/ DOI 07/18/21/fell on both knees and hurt my wrist. Went to Urgent Care the same day.   Encounter Diagnosis  Name Primary?   Pain in left wrist Yes     65 year old female fell onto both knees and both wrists complained of pain in the left wrist and was evaluated in placed into a thumb splint with a recommendation to follow-up with orthopedics  She does have pain on the volar aspect of the hand and wrist and somewhat in the thumb area  She is has been wearing the removable thumb spica splint  Her exam is consistent with pain tenderness and swelling in the wrist joint and volar aspect of the hand including scaphoid tubercle and snuffbox with some mild dorsal tenderness and swelling of the wrist joint itself  Neurovascular exam is intact  The skin is intact.  The x-ray today 3 views of the wrist shows no abnormality  However, recommend repeat x-rays in 3 weeks continue wearing splint

## 2021-09-02 ENCOUNTER — Ambulatory Visit (INDEPENDENT_AMBULATORY_CARE_PROVIDER_SITE_OTHER): Payer: Commercial Managed Care - PPO | Admitting: Orthopedic Surgery

## 2021-09-02 ENCOUNTER — Ambulatory Visit: Payer: Commercial Managed Care - PPO

## 2021-09-02 ENCOUNTER — Other Ambulatory Visit: Payer: Self-pay

## 2021-09-02 ENCOUNTER — Encounter: Payer: Self-pay | Admitting: Orthopedic Surgery

## 2021-09-02 VITALS — BP 156/97 | HR 82 | Ht 65.0 in | Wt 194.5 lb

## 2021-09-02 DIAGNOSIS — M25532 Pain in left wrist: Secondary | ICD-10-CM

## 2021-09-02 NOTE — Patient Instructions (Signed)
Tylenol arthritis 1 in the morning and 1 in the evening   Wean the brace 1.5 hrs less every day

## 2021-09-02 NOTE — Progress Notes (Signed)
FOLLOW UP   Encounter Diagnosis  Name Primary?   Pain in left wrist Yes     Chief Complaint  Patient presents with   Wrist Pain    L/     Andrea Gregory is here in follow-up regarding her persistent left wrist pain she has been in the brace now since-early September she says its been about 7 weeks  She still has pain around the left thumb  Her x-ray does not show a fracture and I compared it to the previous films  I tested her scaphoid tubercle as well as the basilar joint of her thumb despite having pain in both areas she did not have pain with the [facility] test she did have pain at the base of the thumb and the [facility] joint  I do not think she has a scaphoid fracture oh could be showing some signs by now.  I like her to wean herself out of this brace and take Tylenol arthritis for pain I will see her in 4 weeks for follow-up to see if we are making progress

## 2021-09-21 DEATH — deceased

## 2021-09-30 ENCOUNTER — Ambulatory Visit: Payer: Commercial Managed Care - PPO | Admitting: Orthopedic Surgery

## 2021-10-21 ENCOUNTER — Other Ambulatory Visit: Payer: Self-pay | Admitting: Obstetrics and Gynecology

## 2021-10-21 DIAGNOSIS — Z1231 Encounter for screening mammogram for malignant neoplasm of breast: Secondary | ICD-10-CM

## 2021-12-02 ENCOUNTER — Ambulatory Visit: Payer: Commercial Managed Care - PPO

## 2023-01-19 ENCOUNTER — Encounter: Payer: Self-pay | Admitting: Radiology

## 2023-03-01 ENCOUNTER — Encounter: Payer: Self-pay | Admitting: *Deleted

## 2024-07-25 ENCOUNTER — Ambulatory Visit (HOSPITAL_COMMUNITY)
Admission: RE | Admit: 2024-07-25 | Discharge: 2024-07-25 | Disposition: A | Discharge status: Home / Self Care | Source: Ambulatory Visit | Attending: Adult Health | Admitting: Adult Health

## 2024-07-25 ENCOUNTER — Ambulatory Visit (HOSPITAL_COMMUNITY)
Admission: RE | Admit: 2024-07-25 | Discharge: 2024-07-25 | Disposition: A | Discharge status: Home / Self Care | Source: Ambulatory Visit | Attending: Adult Health

## 2024-07-25 ENCOUNTER — Other Ambulatory Visit (HOSPITAL_COMMUNITY): Payer: Self-pay | Admitting: Adult Health

## 2024-07-25 DIAGNOSIS — M79671 Pain in right foot: Secondary | ICD-10-CM | POA: Diagnosis present

## 2024-07-25 DIAGNOSIS — M25471 Effusion, right ankle: Secondary | ICD-10-CM | POA: Insufficient documentation

## 2024-08-14 ENCOUNTER — Ambulatory Visit (INDEPENDENT_AMBULATORY_CARE_PROVIDER_SITE_OTHER): Admitting: Orthopedic Surgery

## 2024-08-14 ENCOUNTER — Encounter: Payer: Self-pay | Admitting: Orthopedic Surgery

## 2024-08-14 VITALS — Ht 65.0 in | Wt 177.0 lb

## 2024-08-14 DIAGNOSIS — M7661 Achilles tendinitis, right leg: Secondary | ICD-10-CM | POA: Diagnosis not present

## 2024-08-14 MED ORDER — DICLOFENAC SODIUM 50 MG PO TBEC: 50.0000 mg | DELAYED_RELEASE_TABLET | Freq: Two times a day (BID) | ORAL | 0 refills | Status: DC

## 2024-08-14 NOTE — Patient Instructions (Signed)
Achilles Tendinitis Rehab  Ask your health care provider which exercises are safe for you. Do exercises exactly as told by your health care provider and adjust them as directed. It is normal to feel mild stretching, pulling, tightness, or discomfort as you do these exercises. Stop right away if you feel sudden pain or your pain gets worse. Do not begin these exercises until told by your health care provider.  Stretching and range-of-motion exercises These exercises warm up your muscles and joints and improve the movement and flexibility of your ankle. These exercises also help to relieve pain.  Standing wall calf stretch with straight knee    Stand with your hands against a wall. Extend your left / right leg behind you, and bend your front knee slightly. Keep both of your heels on the floor. Point the toes of your back foot slightly inward. Keeping your heels on the floor and your back knee straight, shift your weight toward the wall. Do not allow your back to arch. You should feel a gentle stretch in your upper calf. Hold this position for 10 seconds. Repeat 10 times. Complete this exercise 3-4 times per week.  Standing wall calf stretch with bent knee Stand with your hands against a wall. Extend your left / right leg behind you, and bend your front knee slightly. Keep both of your heels on the floor. Point the toes of your back foot slightly inward. Keeping your heels on the floor, bend your back knee slightly. You should feel a gentle stretch deep in your lower calf near your heel. Hold this position for 10 seconds. Repeat 10 times. Complete this exercise 3-4 times per week.  Strengthening exercises These exercises build strength and control of your ankle. Endurance is the ability to use your muscles for a long time, even after they get tired.  Plantar flexion with band    In this exercise, you push your toes downward, away from you, with an exercise band providing  resistance. Sit on the floor with your left / right leg extended. You may put a pillow under your calf to give your foot more room to move. Loop a rubber exercise band or tube around the ball of your left / right foot. The ball of your foot is on the walking surface, right under your toes. The band or tube should be slightly tense when your foot is relaxed. If the band or tube slips, you can put on your shoe or put a washcloth between the band and your foot to help it stay in place. Slowly point your toes downward, pushing them away from you (plantar flexion). Hold this position for 10 seconds. Slowly release the tension in the band or tube, controlling smoothly until your foot is back to the starting position. Repeat steps 1-5 with your left / right leg. Repeat 10 times. Complete this exercise 3-4 times per week.   Eccentric heel drop    In this exercise, you stand and slowly raise your heel and then slowly lower it. This exercise lengthens the calf muscles (eccentric) while the heel bears weight. If this exercise is too easy, try doing it while wearing a backpack with weights in it. Stand on a step with the balls of your feet. The ball of your foot is on the walking surface, right under your toes. Do not put your heels on the step. For balance, rest your hands on the wall or on a railing. Rise up onto the balls of your feet.  Keeping your heels up, shift all of your weight to your left / right leg and pick up your other leg. Slowly lower your left / right leg so your heel drops below the level of the step. Put down your other foot before returning to the start position. If told by your health care provider, build up to: 3 sets of 15 repetitions while keeping your knees straight. 3 sets of 15 repetitions while keeping your knees slightly bent as far as told by your health care provider. Repeat 10 times. Complete this exercise 3-4 times per week.   Balance exercises These exercises  improve or maintain your balance. Balance is important in preventing falls.   Single leg stand If this exercise is too easy, you can try it with your eyes closed or while standing on a pillow. Without shoes, stand near a railing or in a door frame. Hold on to the railing or door frame as needed. Stand on your left / right foot. Keep your big toe down on the floor and try to keep your arch lifted. Hold this position for 10 seconds. Repeat 10 times. Complete this exercise 3-4 times per week.   This information is not intended to replace advice given to you by your health care provider. Make sure you discuss any questions you have with your health care provider.

## 2024-08-14 NOTE — Progress Notes (Signed)
 New Patient Visit  Assessment: Andrea Gregory is a 68 y.o. female with the following: 1. Insertional tendinopathy of right Achilles tendon  Plan: Andrea Gregory has pain and tenderness at the insertion of the Achilles in the right heel.  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  No specific injury.  We discussed the pathology, as well as multiple treatment options.  She is elected proceed with home exercises, as well as a prescription for diclofenac .  If she continues to have issues, she will return to clinic.  Otherwise, follow-up as needed.  Follow-up: Return if symptoms worsen or fail to improve.  Subjective:  Chief Complaint  Patient presents with   Foot Pain    R ankle/heel nodule for 1 mo. Usually has a numb sensation to it but if any pressure is put on it or if she wears flat shoes it causes the area to hurt. No injury that she can think of.     History of Present Illness: Andrea Gregory is a 68 y.o. female who has been referred by  Andrea Petite, NP for evaluation of right heel pain.  She has had pain in the heel for 3-4 weeks.  No specific injury.  This has not happened to her before.  With the onset of pain, she noticed a nodule in the back part of her heel.  She states pressure makes the pain worse.  She has been doing better with open back shoes.  She also does better with a lift in her shoe.  She has not tried any medicines or topical treatments.   Review of Systems: No fevers or chills No numbness or tingling No chest pain No shortness of breath No bowel or bladder dysfunction No GI distress No headaches   Medical History:  Past Medical History:  Diagnosis Date   Aortic atherosclerosis    a. seen on CT 04/2017.   Arthritis    Depression    GERD (gastroesophageal reflux disease)    Hyperlipidemia    INSOMNIA, CHRONIC 08/25/2009   MIGRAINE HEADACHE 08/25/2009   Seasonal allergies    SPRAIN AND STRAIN OF UNSPECIFIED SITE OF HAND 03/31/2010   pt states that  she doesnt have this hx    Past Surgical History:  Procedure Laterality Date   ABDOMINAL HYSTERECTOMY  11/21/1982   APPENDECTOMY  11/21/2000   BLADDER SUSPENSION  11/22/2003   BLADDER SUSPENSION     COLONOSCOPY WITH ESOPHAGOGASTRODUODENOSCOPY (EGD) N/A 03/28/2013   Procedure: COLONOSCOPY WITH ESOPHAGOGASTRODUODENOSCOPY (EGD);  Surgeon: Andrea CHRISTELLA Hollingshead, MD;  Location: [facility];  Service: Endoscopy;  Laterality: N/A;  8:30-moved to [name] to notify pt   Gregory AND CURETTAGE OF UTERUS  11/21/1981   exploratory laprotomy  11/21/1992   KNEE ARTHROSCOPY WITH MEDIAL MENISECTOMY Left 05/21/2021   Procedure: LEFT KNEE ARTHROSCOPY WITH MEDIAL MENISCECTOMY;  Surgeon: Andrea Taft BRAVO, MD;  Location: [facility];  Service: Orthopedics;  Laterality: Left;   MALONEY Gregory N/A 03/28/2013   Procedure: Andrea Gregory;  Surgeon: Andrea CHRISTELLA Hollingshead, MD;  Location: [facility];  Service: Endoscopy;  Laterality: N/A;   miscarriage  11/21/1976   Andrea Gregory N/A 03/28/2013   Procedure: Andrea Gregory;  Surgeon: Andrea CHRISTELLA Hollingshead, MD;  Location: [facility];  Service: Endoscopy;  Laterality: N/A;   TONSILLECTOMY      Family History  Problem Relation Age of Onset   Diabetes Other    Hypertension Other    Stroke Other    Miscarriages /  Stillbirths Other    Alcohol abuse Other    Mental illness Other    Ovarian cancer Sister        ovarian   Colon cancer Paternal Grandmother        age >65   Breast cancer Paternal Aunt        two   Angina Mother 69   Lung cancer Neg Hx    Social History   Tobacco Use   Smoking status: Former    Current packs/day: 0.00    Average packs/day: 0.3 packs/day for 35.0 years (8.8 ttl pk-yrs)    Types: Cigarettes    Start date: 04/11/1980    Quit date: 04/12/2015    Years since quitting: 9.3   Smokeless tobacco: Never  Substance Use Topics   Alcohol use: No    Comment: occ   Drug use: No    No Known Allergies  Current Meds  Medication Sig    Cholecalciferol 125 MCG (5000 UT) TABS Take 1 tablet every day by oral route.   diclofenac  (VOLTAREN ) 50 MG EC tablet Take 1 tablet (50 mg total) by mouth 2 (two) times daily.    Objective: Ht 5' 5 (1.651 m)   Wt 177 lb (80.3 kg)   BMI 29.45 kg/m   Physical Exam:  General: Alert and oriented. and No acute distress. Gait: Right sided antalgic gait.  Right heel with no obvious swelling or growths.  Tenderness to palpation at the insertion of the Achilles.  Just a few degrees of dorsiflexion with the knee extended.  This improves slightly with the knee bent.  Similar type appearance of the left heel, in comparison to the right.  Toes warm and well-perfused.  IMAGING: I personally reviewed images previously obtained in clinic   X-ray of the right foot and ankle was available in clinic today.  These were previously obtained.  No acute injuries.  No calcium  deposition at the insertion of the Achilles.  Small Haglund deformity.   New Medications:  Meds ordered this encounter  Medications   diclofenac  (VOLTAREN ) 50 MG EC tablet    Sig: Take 1 tablet (50 mg total) by mouth 2 (two) times daily.    Dispense:  60 tablet    Refill:  0      Andrea DELENA Horde, MD  08/14/2024 9:35 AM

## 2024-09-11 ENCOUNTER — Other Ambulatory Visit: Payer: Self-pay | Admitting: Orthopedic Surgery

## 2024-10-08 ENCOUNTER — Other Ambulatory Visit: Payer: Self-pay | Admitting: Orthopedic Surgery

## 2024-10-18 ENCOUNTER — Encounter (INDEPENDENT_AMBULATORY_CARE_PROVIDER_SITE_OTHER): Payer: Self-pay

## 2024-10-23 ENCOUNTER — Encounter: Payer: Self-pay | Admitting: Allergy & Immunology

## 2024-10-23 ENCOUNTER — Ambulatory Visit (INDEPENDENT_AMBULATORY_CARE_PROVIDER_SITE_OTHER): Admitting: Allergy & Immunology

## 2024-10-23 VITALS — BP 122/72 | HR 62 | Temp 98.0°F | Ht 63.5 in | Wt 189.0 lb

## 2024-10-23 DIAGNOSIS — K146 Glossodynia: Secondary | ICD-10-CM | POA: Diagnosis not present

## 2024-10-23 DIAGNOSIS — T783XXA Angioneurotic edema, initial encounter: Secondary | ICD-10-CM | POA: Diagnosis not present

## 2024-10-23 DIAGNOSIS — R220 Localized swelling, mass and lump, head: Secondary | ICD-10-CM | POA: Diagnosis not present

## 2024-10-23 NOTE — Patient Instructions (Addendum)
 1. 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 - I am going to get some labs to look for weird causes of burning/swelling. - We are going to do some nutritional labs as well.  - We are going to do some food testing (sheet provided).  2. Chronic rhinitis - Because of insurance stipulations, we cannot do skin testing on the same day as your first visit. - We are all working to fight this, but for now we need to do two separate visits.  - We will know more after we do testing at the next visit.  - The skin testing visit can be squeezed in at your convenience.  - Then we can make a more full plan to address all of your symptoms. - Be sure to stop your antihistamines for 3 days before this appointment.   3. Return in about 1 week (around 10/30/2024) for SKIN TESTING (1-55 + select foods). You can have the follow up appointment with Dr. Iva or a Nurse Practicioner (our Nurse Practitioners are excellent and always have Physician oversight!).    Please inform us  of any Emergency Department visits, hospitalizations, or changes in symptoms. Call us  before going to the ED for breathing or allergy symptoms since we might be able to fit you in for a sick visit. Feel free to contact us  anytime with any questions, problems, or concerns.  It was a pleasure to meet you today!  Websites that have reliable patient information: 1. American Academy of Asthma, Allergy, and Immunology: www.aaaai.org 2. Food Allergy Research and Education (FARE): foodallergy.org 3. Mothers of Asthmatics: http://www.asthmacommunitynetwork.org 4. American College of Allergy, Asthma, and Immunology: www.acaai.org      "Like" us  on Facebook and Instagram for our latest updates!      A healthy democracy works best when [employer] participate! Make sure you are registered to vote! If you have moved or changed any of your contact information, you  will need to get this updated before voting! Scan the QR codes below to learn more!          Burning mouth syndrome -- Burning mouth syndrome is characterized by an intraoral burning sensation for which no medical or dental cause can be found [1]. Pain may be restricted to the tongue or just the tip of the tongue and may be associated with dysesthesia, altered taste, and/or a sensation of having a dry mouth. This uncommon condition predominantly affects females in the sixth and seventh decades of life [52-54]. However, in a population-based epidemiologic study from [facility], burning mouth syndrome was most commonly diagnosed after age 107 years [55]. Approximately 30 to 50 percent of patients improve spontaneously [56]. An etiologic role for psychologic factors such as anxiety and depression has been suggested as they are common comorbidities [57-59].  Although no definitive etiology has been established, one study suggested that trigeminal small-fiber sensory neuropathy is the cause of so-called idiopathic burning mouth syndrome [60]. Other studies identified a significantly higher number of unoccupied D2 dopamine receptors in the putamen associated with painful clinical conditions [61]. In this regard, a report described a patient with burning mouth syndrome whose pain responded to pramipexole, a nonergot dopamine agonist with a high selectivity for dopaminergic D2 receptors [62]. A subsequent case series reported six patients who failed other therapies but improved after treatment with pramipexole [63].  Diagnostic criteria for burning mouth syndrome, according to the ICHD-3, require all  of the following [1]:  ?Oral pain  ?Recurring daily for more than two hours per day for greater than three months  ?Pain has both of the following characteristics:  Burning quality  Felt superficially in the oral mucosa  ?Oral mucosa is of normal appearance, and clinical examination including sensory  testing is normal  ?Not better accounted for by another ICHD-3 diagnosis  Prior to making the diagnosis, it is important to rule out oral mucosal diseases, such as herpes simplex and aphthous stomatitis. Other common conditions associated with mouth pain are psychiatric disorders, xerostomia (from drugs, connective tissue disease, or age), nutritional deficiencies (vitamin B12, iron, folate, zinc, vitamin B6), and allergic contact stomatitis. More unusual causes of mouth pain include geographic tongue, candidiasis, diabetes, denture-related pain, thyroid  abnormalities, laryngopharyngeal reflux, and menopause [64-66]. Treating the underlying cause of mouth pain, if found, usually results in the remission of the symptoms [56]. When no underlying cause of symptoms is found, the condition is considered idiopathic burning mouth syndrome.  We suggest gabapentin  or pregabalin as initial pharmacologic therapy for idiopathic burning mouth syndrome. Other alternatives or adjunctive treatments include amitriptyline, clonazepam, oral or topical capsaicin, and alpha-lipoic acid. Systematic reviews of treatment trials for burning mouth syndrome found several medications may be effective, including oral and topical clonazepam, gabapentin , pregabalin, oral or topical capsaicin, and alpha lipoic acid [67,68]. The quality of evidence to support the efficacy of alpha lipoic acid is low, but it may have a role as adjunctive treatment in combination with other agents [67].

## 2024-10-23 NOTE — Progress Notes (Unsigned)
 NEW PATIENT  Date of Service/Encounter:  10/23/24  Consult requested by: Benjamin Raina Elizabeth, NP   Assessment:   Halloween and Christmas lover   Plan/Recommendations:   There are no Patient Instructions on file for this visit.   {Blank single:19197::This note in its entirety was forwarded to the Provider who requested this consultation.}  Subjective:   TEANNA ELEM is a 68 y.o. female presenting today for evaluation of  Chief Complaint  Patient presents with  . Establish Care    REBECKA OELKERS has a history of the following: Patient Active Problem List   Diagnosis Date Noted  . Acute medial meniscus tear of left knee   . 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 Hepatic steatosis 05/31/2017  . Anxiety state 02/18/2015  . GERD (gastroesophageal reflux disease) 03/05/2013  . Major depressive disorder, recurrent episode 01/08/2013  . Hyperlipidemia 10/31/2011  . SPRAIN AND STRAIN OF UNSPECIFIED SITE OF HAND 03/31/2010  . URI 10/22/2009  . INSOMNIA, CHRONIC 08/25/2009  . MIGRAINE HEADACHE 08/25/2009  . ACUTE BRONCHITIS 08/25/2009    History obtained from: chart review and {Persons; PED relatives w/patient:19415::patient}.  Discussed the use of AI scribe software for clinical note transcription with the patient and/or guardian, who gave verbal consent to proceed.  [name] was referred by Benjamin Raina Elizabeth, NP.     Ilana is a 68 y.o. female presenting for {Blank single:19197::a food challenge,a drug challenge,skin testing,a sick visit,an evaluation of ***,a follow up visit}.    Asthma/Respiratory Symptom History: ***  Allergic Rhinitis Symptom History: ***  Food Allergy Symptom History: ***  Skin Symptom History: ***  GERD Symptom History: ***  Infection Symptom History: ***  ***Otherwise, there is no history of other atopic diseases, including  {Blank multiple:19196:o:asthma,food allergies,drug allergies,environmental allergies,stinging insect allergies,eczema,urticaria,contact dermatitis}. There is no significant infectious history. ***Vaccinations are up to date.    Past Medical History: Patient Active Problem List   Diagnosis Date Noted  . Acute medial meniscus tear of left knee   . 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 Hepatic steatosis 05/31/2017  . Anxiety state 02/18/2015  . GERD (gastroesophageal reflux disease) 03/05/2013  . Major depressive disorder, recurrent episode 01/08/2013  . Hyperlipidemia 10/31/2011  . SPRAIN AND STRAIN OF UNSPECIFIED SITE OF HAND 03/31/2010  . URI 10/22/2009  . INSOMNIA, CHRONIC 08/25/2009  . MIGRAINE HEADACHE 08/25/2009  . ACUTE BRONCHITIS 08/25/2009    Medication List:  Allergies as of 10/23/2024   No Known Allergies      Medication List        Accurate as of October 23, 2024  4:33 PM. If you have any questions, ask your nurse or doctor.          chlorthalidone 25 MG tablet Commonly known as: HYGROTON Take 25 mg by mouth daily.   Cholecalciferol 125 MCG (5000 UT) Tabs Take 1 tablet every day by oral route.   diclofenac  50 MG EC tablet Commonly known as: VOLTAREN  Take 1 tablet by mouth twice daily with food   escitalopram  20 MG tablet Commonly known as: LEXAPRO  Take 10 mg by mouth daily.   ezetimibe 10 MG tablet Commonly known as: ZETIA Take 10 mg by mouth daily.   IRON PO   Melatonin 10 MG Tabs Take 10 mg by mouth at bedtime.   pantoprazole  40 MG tablet Commonly known as: PROTONIX  TAKE  1 TABLET BY MOUTH TWICE DAILY WITH FOOD        Birth History: {Blank single:19197::non-contributory,born premature and spent time in the NICU,born at term without complications}  Developmental History: Daniyah has met all milestones on time. She has required no {Blank  multiple:19196:a:speech therapy,occupational therapy,physical therapy}. ***non-contributory  Past Surgical History: Past Surgical History:  Procedure Laterality Date  . ABDOMINAL HYSTERECTOMY  11/21/1982  . APPENDECTOMY  11/21/2000  . BLADDER SUSPENSION  11/22/2003  . BLADDER SUSPENSION    . COLONOSCOPY WITH ESOPHAGOGASTRODUODENOSCOPY (EGD) N/A 03/28/2013   Procedure: COLONOSCOPY WITH ESOPHAGOGASTRODUODENOSCOPY (EGD);  Surgeon: Lamar CHRISTELLA Hollingshead, MD;  Location: [facility];  Service: Endoscopy;  Laterality: N/A;  8:30-moved to [name] to notify pt  . DILATION AND CURETTAGE OF UTERUS  11/21/1981  . exploratory laprotomy  11/21/1992  . KNEE ARTHROSCOPY WITH MEDIAL MENISECTOMY Left 05/21/2021   Procedure: LEFT KNEE ARTHROSCOPY WITH MEDIAL MENISCECTOMY;  Surgeon: Margrette Taft BRAVO, MD;  Location: [facility];  Service: Orthopedics;  Laterality: Left;  SABRA MALONEY DILATION N/A 03/28/2013   Procedure: AGAPITO DILATION;  Surgeon: Lamar CHRISTELLA Hollingshead, MD;  Location: [facility];  Service: Endoscopy;  Laterality: N/A;  . miscarriage  11/21/1976  . SAVORY DILATION N/A 03/28/2013   Procedure: SAVORY DILATION;  Surgeon: Lamar CHRISTELLA Hollingshead, MD;  Location: [facility];  Service: Endoscopy;  Laterality: N/A;  . TONSILLECTOMY       Family History: Family History  Problem Relation Age of Onset  . Diabetes Other   . Hypertension Other   . Stroke Other   . Miscarriages / Stillbirths Other   . Alcohol abuse Other   . Mental illness Other   . Ovarian cancer Sister        ovarian  . Colon cancer Paternal Grandmother        age >43  . Breast cancer Paternal Aunt        two  . Angina Mother 1  . Lung cancer Neg Hx      Social History: Crystina lives at home with ***.    Review of systems otherwise negative other than that mentioned in the HPI.    Objective:   Blood pressure 122/72, pulse 62, temperature 98 F (36.7 C), temperature source Temporal, height 5' 3.5 (1.613 m), weight 189 lb  (85.7 kg), SpO2 99%. Body mass index is 32.95 kg/m.     Physical Exam   Diagnostic studies: {Blank single:19197::none,deferred due to recent antihistamine use,deferred due to insurance stipulations that require a separate visit for testing,labs sent instead, }  Spirometry: {Blank single:19197::results normal (FEV1: ***%, FVC: ***%, FEV1/FVC: ***%),results abnormal (FEV1: ***%, FVC: ***%, FEV1/FVC: ***%)}.    {Blank single:19197::Spirometry consistent with mild obstructive disease,Spirometry consistent with moderate obstructive disease,Spirometry consistent with severe obstructive disease,Spirometry consistent with possible restrictive disease,Spirometry consistent with mixed obstructive and restrictive disease,Spirometry uninterpretable due to technique,Spirometry consistent with normal pattern}. {Blank single:19197::Albuterol /Atrovent nebulizer,Xopenex/Atrovent nebulizer,Albuterol  nebulizer,Albuterol  four puffs via MDI,Xopenex four puffs via MDI} treatment given in clinic with {Blank single:19197::significant improvement in FEV1 per ATS criteria,significant improvement in FVC per ATS criteria,significant improvement in FEV1 and FVC per ATS criteria,improvement in FEV1, but not significant per ATS criteria,improvement in FVC, but not significant per ATS criteria,improvement in FEV1 and FVC, but not significant per ATS criteria,no improvement}.  Allergy Studies: {Blank single:19197::none,deferred due to recent antihistamine use,deferred due to insurance stipulations that require a separate visit for testing,labs sent instead, }    {Blank single:19197::Allergy testing results were read and interpreted by  myself, documented by clinical staff., }         Marty Shaggy, MD [facility] 

## 2024-10-28 LAB — CBC WITH DIFFERENTIAL/PLATELET
Basophils Absolute: 0.1 x10E3/uL (ref 0.0–0.2)
Basos: 1 %
EOS (ABSOLUTE): 0.3 x10E3/uL (ref 0.0–0.4)
Eos: 3 %
Hematocrit: 37.7 % (ref 34.0–46.6)
Hemoglobin: 12.1 g/dL (ref 11.1–15.9)
Immature Grans (Abs): 0 x10E3/uL (ref 0.0–0.1)
Immature Granulocytes: 0 %
Lymphocytes Absolute: 3.4 x10E3/uL — ABNORMAL HIGH (ref 0.7–3.1)
Lymphs: 34 %
MCH: 26.4 pg — ABNORMAL LOW (ref 26.6–33.0)
MCHC: 32.1 g/dL (ref 31.5–35.7)
MCV: 82 fL (ref 79–97)
Monocytes Absolute: 0.8 x10E3/uL (ref 0.1–0.9)
Monocytes: 8 %
Neutrophils Absolute: 5.4 x10E3/uL (ref 1.4–7.0)
Neutrophils: 54 %
Platelets: 371 x10E3/uL (ref 150–450)
RBC: 4.59 x10E6/uL (ref 3.77–5.28)
RDW: 15.1 % (ref 11.7–15.4)
WBC: 10.1 x10E3/uL (ref 3.4–10.8)

## 2024-10-28 LAB — IRON,TIBC AND FERRITIN PANEL
Ferritin: 17 ng/mL (ref 15–150)
Iron Saturation: 10 % — ABNORMAL LOW (ref 15–55)
Iron: 39 ug/dL (ref 27–139)
Total Iron Binding Capacity: 386 ug/dL (ref 250–450)
UIBC: 347 ug/dL (ref 118–369)

## 2024-10-28 LAB — TRYPTASE: Tryptase: 6 ug/L (ref 2.2–13.2)

## 2024-10-28 LAB — VITAMIN B6: Vitamin B6: 4.1 ug/L (ref 3.4–65.2)

## 2024-10-28 LAB — B12 AND FOLATE PANEL
Folate: 9.8 ng/mL (ref >3.0)
Vitamin B-12: 166 pg/mL — ABNORMAL LOW (ref 232–1245)

## 2024-10-28 LAB — ANTINUCLEAR ANTIBODIES, IFA

## 2024-10-28 LAB — C-REACTIVE PROTEIN: CRP: 3 mg/L (ref 0–10)

## 2024-10-28 LAB — SEDIMENTATION RATE: Sed Rate: 11 mm/h (ref 0–40)

## 2024-10-28 LAB — ZINC: Zinc: 64 ug/dL (ref 44–115)

## 2024-10-28 LAB — VITAMIN B1: Thiamine: 126.2 nmol/L (ref 66.5–200.0)

## 2024-10-28 LAB — RHEUMATOID FACTOR: Rheumatoid fact SerPl-aCnc: <10 [IU]/mL (ref <14.0)

## 2024-10-31 ENCOUNTER — Ambulatory Visit: Payer: Self-pay | Admitting: Allergy & Immunology

## 2024-11-05 ENCOUNTER — Other Ambulatory Visit: Payer: Self-pay | Admitting: Orthopedic Surgery

## 2024-11-06 ENCOUNTER — Ambulatory Visit (INDEPENDENT_AMBULATORY_CARE_PROVIDER_SITE_OTHER): Admitting: Allergy & Immunology

## 2024-11-06 ENCOUNTER — Encounter: Payer: Self-pay | Admitting: Allergy & Immunology

## 2024-11-06 DIAGNOSIS — R220 Localized swelling, mass and lump, head: Secondary | ICD-10-CM

## 2024-11-06 DIAGNOSIS — J3089 Other allergic rhinitis: Secondary | ICD-10-CM | POA: Diagnosis not present

## 2024-11-06 DIAGNOSIS — J302 Other seasonal allergic rhinitis: Secondary | ICD-10-CM

## 2024-11-06 MED ORDER — AZELASTINE HCL 0.1 % NA SOLN: 2.0000 | Freq: Two times a day (BID) | NASAL | 5 refills | Status: AC

## 2024-11-06 NOTE — Patient Instructions (Addendum)
 1. 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 - Information on this provided today. - Vitamin B12 was low, please start a supplement and we can follow up on that at the next visit. - Everything else was normal aside from the low mean corpuscular hemoglobin.  - Selected food testing was negative which is good news.   2. Chronic rhinitis - Testing today showed: grasses, ragweed, weeds, trees, outdoor molds, and dust mites - Copy of test results provided.  - Avoidance measures provided. - Stop taking:  - Continue with: an antihistamine (but alternate every 3 months to make sure that it maintains efficacy) - Start taking: Astelin  (azelastine ) 2 sprays per nostril 1-2 times daily as needed - You can use an extra dose of the antihistamine, if needed, for breakthrough symptoms.  - Consider nasal saline rinses 1-2 times daily to remove allergens from the nasal cavities as well as help with mucous clearance (this is especially helpful to do before the nasal sprays are given) - Consider allergy  shots as a means of long-term control. - Allergy  shots re-train and reset the immune system to ignore environmental allergens and decrease the resulting immune response to those allergens (sneezing, itchy watery eyes, runny nose, nasal congestion, etc).    - Allergy  shots improve symptoms in 75-85% of patients.  - We can discuss more at the next appointment if the medications are not working for you.   3. Return in about 3 months (around 02/04/2025). You can have the follow up appointment with Dr. Iva or a Nurse Practicioner (our Nurse Practitioners are excellent and always have Physician oversight!).    Please inform us  of any Emergency Department visits, hospitalizations, or changes in symptoms. Call us  before going to the ED for breathing or allergy  symptoms since we might be able to fit you in for a sick visit. Feel free  to contact us  anytime with any questions, problems, or concerns.  It was a pleasure to see you today today!  Websites that have reliable patient information: 1. American Academy of Asthma, Allergy , and Immunology: www.aaaai.org 2. Food Allergy  Research and Education (FARE): foodallergy.org 3. Mothers of Asthmatics: http://www.asthmacommunitynetwork.org 4. American College of Allergy , Asthma, and Immunology: www.acaai.org      Like us  on [employer] and Instagram for our latest updates!      A healthy democracy works best when [employer] participate! Make sure you are registered to vote! If you have moved or changed any of your contact information, you will need to get this updated before voting! Scan the QR codes below to learn more!          Burning mouth syndrome -- Burning mouth syndrome is characterized by an intraoral burning sensation for which no medical or dental cause can be found [1]. Pain may be restricted to the tongue or just the tip of the tongue and may be associated with dysesthesia, altered taste, and/or a sensation of having a dry mouth. This uncommon condition predominantly affects females in the sixth and seventh decades of life [52-54]. However, in a population-based epidemiologic study from [facility], burning mouth syndrome was most commonly diagnosed after age 7 years [55]. Approximately 30 to 50 percent of patients improve spontaneously [56]. An etiologic role for psychologic factors such as anxiety and depression has been suggested as they are common comorbidities [57-59].  Although no definitive etiology has been established, one study suggested that trigeminal small-fiber  sensory neuropathy is the cause of so-called idiopathic burning mouth syndrome [60]. Other studies identified a significantly higher number of unoccupied D2 dopamine receptors in the putamen associated with painful clinical conditions [61]. In this regard, a report described a patient  with burning mouth syndrome whose pain responded to pramipexole, a nonergot dopamine agonist with a high selectivity for dopaminergic D2 receptors [62]. A subsequent case series reported six patients who failed other therapies but improved after treatment with pramipexole [63].  Diagnostic criteria for burning mouth syndrome, according to the ICHD-3, require all of the following [1]:  ?Oral pain  ?Recurring daily for more than two hours per day for greater than three months  ?Pain has both of the following characteristics:  Burning quality  Felt superficially in the oral mucosa  ?Oral mucosa is of normal appearance, and clinical examination including sensory testing is normal  ?Not better accounted for by another ICHD-3 diagnosis  Prior to making the diagnosis, it is important to rule out oral mucosal diseases, such as herpes simplex and aphthous stomatitis. Other common conditions associated with mouth pain are psychiatric disorders, xerostomia (from drugs, connective tissue disease, or age), nutritional deficiencies (vitamin B12, iron, folate, zinc , vitamin B6), and allergic contact stomatitis. More unusual causes of mouth pain include geographic tongue, candidiasis, diabetes, denture-related pain, thyroid  abnormalities, laryngopharyngeal reflux, and menopause [64-66]. Treating the underlying cause of mouth pain, if found, usually results in the remission of the symptoms [56]. When no underlying cause of symptoms is found, the condition is considered idiopathic burning mouth syndrome.  We suggest gabapentin  or pregabalin as initial pharmacologic therapy for idiopathic burning mouth syndrome. Other alternatives or adjunctive treatments include amitriptyline, clonazepam, oral or topical capsaicin, and alpha-lipoic acid. Systematic reviews of treatment trials for burning mouth syndrome found several medications may be effective, including oral and topical clonazepam, gabapentin , pregabalin, oral  or topical capsaicin, and alpha lipoic acid [67,68]. The quality of evidence to support the efficacy of alpha lipoic acid is low, but it may have a role as adjunctive treatment in combination with other agents [67].   Airborne Adult Perc - 11/06/24 0841     Time Antigen Placed 9157    Allergen Manufacturer Jestine    Location Back    Number of Test 55    1. Control-Buffer 50% Glycerol Negative    2. Control-Histamine 3+    3. Bahia Negative    4. Bermuda 2+    5. Johnson Negative    6. [place]  Blue Negative    7. Meadow Fescue Negative    8. Perennial Rye Negative    9. Timothy Negative    10. Ragweed Mix 2+    11. Cocklebur Negative    12. Plantain,  English Negative    13. Baccharis 2+    14. Dog Fennel Negative    15. Russian Thistle 2+    16. Lamb's Quarters 2+    17. Sheep Sorrell 2+    18. Rough Pigweed Negative    19. Marsh Elder, Rough Negative    20. Mugwort, Common Negative    21. Box, Elder Negative    22. Cedar, red Negative    23. Sweet Gum Negative    24. Pecan Pollen Negative    25. Pine Mix Negative    26. Walnut, Black Pollen --   +/-   27. Red Mulberry Negative    28. Ash Mix Negative    29. Birch Mix Negative    30. [place] American Negative  31. Cottonwood, Eastern Negative    32. Hickory, White Negative    33. Maple Mix Negative    34. Oak, Eastern Mix Negative    35. Sycamore Eastern Negative    36. Alternaria Alternata Negative    37. Cladosporium Herbarum Negative    38. Aspergillus Mix Negative    39. Penicillium Mix Negative    40. Bipolaris Sorokiniana (Helminthosporium) Negative    41. Drechslera Spicifera (Curvularia) Negative    42. Mucor Plumbeus 2+    43. Fusarium Moniliforme Negative    44. Aureobasidium Pullulans (pullulara) Negative    45. Rhizopus Oryzae Negative    46. Botrytis Cinera Negative    47. Epicoccum Nigrum Negative    48. Phoma Betae Negative    49. Dust Mite Mix Negative    50. Cat Hair 10,000 BAU/ml Negative     51.  Dog Epithelia Negative    52. Mixed Feathers Negative    53. Horse Epithelia Negative    54. Cockroach, German Negative    55. Tobacco Leaf Negative          Intradermal - 11/06/24 0924     Time Antigen Placed 0930    Allergen Manufacturer Jestine    Location Arm    Number of Test 12    Control Negative    Bahia Negative    Johnson Negative    7 Grass Negative    Tree Mix Negative    Mold 1 Negative    Mold 2 Negative    Mold 4 Negative    Mite Mix 3+    Cat Negative    Dog Negative    Cockroach Negative          Food Adult Perc - 11/06/24 0800     Time Antigen Placed 9157    Allergen Manufacturer Jestine    Location Back    Number of allergen test 5    38. Tomato Negative    47. Onion Negative    62. Cherry Negative    65. Pineapple Negative    66. Chocolate/Cacao Bean Negative           Reducing Pollen Exposure  The American Academy of Allergy , Asthma and Immunology suggests the following steps to reduce your exposure to pollen during allergy  seasons.    Do not hang sheets or clothing out to dry; pollen may collect on these items. Do not mow lawns or spend time around freshly cut grass; mowing stirs up pollen. Keep windows closed at night.  Keep car windows closed while driving. Minimize morning activities outdoors, a time when pollen counts are usually at their highest. Stay indoors as much as possible when pollen counts or humidity is high and on windy days when pollen tends to remain in the air longer. Use air conditioning when possible.  Many air conditioners have filters that trap the pollen spores. Use a HEPA room air filter to remove pollen form the indoor air you breathe.  Control of Mold Allergen   Mold and fungi can grow on a variety of surfaces provided certain temperature and moisture conditions exist.  Outdoor molds grow on plants, decaying vegetation and soil.  The major outdoor mold, Alternaria and Cladosporium, are found in very high  numbers during hot and dry conditions.  Generally, a late Summer - Fall peak is seen for common outdoor fungal spores.  Rain will temporarily lower outdoor mold spore count, but counts rise rapidly when the rainy period ends.  The most  important indoor molds are Aspergillus and Penicillium.  Dark, humid and poorly ventilated basements are ideal sites for mold growth.  The next most common sites of mold growth are the bathroom and the kitchen.  Outdoor (Seasonal) Mold Control  Positive outdoor molds via skin testing: Mucor  Use air conditioning and keep windows closed Avoid exposure to decaying vegetation. Avoid leaf raking. Avoid grain handling. Consider wearing a face mask if working in moldy areas.      Control of Dust Mite Allergen    Dust mites play a major role in allergic asthma and rhinitis.  They occur in environments with high humidity wherever human skin is found.  Dust mites absorb humidity from the atmosphere (ie, they do not drink) and feed on organic matter (including shed human and animal skin).  Dust mites are a microscopic type of insect that you cannot see with the naked eye.  High levels of dust mites have been detected from mattresses, pillows, carpets, upholstered furniture, bed covers, clothes, soft toys and any woven material.  The principal allergen of the dust mite is found in its feces.  A gram of dust may contain 1,000 mites and 250,000 fecal particles.  Mite antigen is easily measured in the air during house cleaning activities.  Dust mites do not bite and do not cause harm to humans, other than by triggering allergies/asthma.    Ways to decrease your exposure to dust mites in your home:  Encase mattresses, box springs and pillows with a mite-impermeable barrier or cover   Wash sheets, blankets and drapes weekly in hot water  (130 F) with detergent and dry them in a dryer on the hot setting.  Have the room cleaned frequently with a vacuum cleaner and a damp  dust-mop.  For carpeting or rugs, vacuuming with a vacuum cleaner equipped with a high-efficiency particulate air (HEPA) filter.  The dust mite allergic individual should not be in a room which is being cleaned and should wait 1 hour after cleaning before going into the room. Do not sleep on upholstered furniture (eg, couches).   If possible removing carpeting, upholstered furniture and drapery from the home is ideal.  Horizontal blinds should be eliminated in the rooms where the person spends the most time (bedroom, study, television room).  Washable vinyl, roller-type shades are optimal. Remove all non-washable stuffed toys from the bedroom.  Wash stuffed toys weekly like sheets and blankets above.   Reduce indoor humidity to less than 50%.  Inexpensive humidity monitors can be purchased at most hardware stores.  Do not use a humidifier as can make the problem worse and are not recommended.

## 2024-11-06 NOTE — Progress Notes (Signed)
 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 - Information on this provided today. - Vitamin B12 was low, please start a supplement and we can follow up on that at the next visit. - Everything else was normal aside from the low mean corpuscular hemoglobin.  - Selected food testing was negative which is good news.   2. Chronic rhinitis - Testing today showed: grasses, ragweed, weeds, trees, outdoor molds, and dust mites - Copy of test results provided.  - Avoidance measures provided. - Stop taking:  - Continue with: an antihistamine (but alternate every 3 months to make sure that it maintains efficacy) - Start taking: Astelin  (azelastine ) 2 sprays per nostril 1-2 times daily as needed - You can use an extra dose of the antihistamine, if needed, for breakthrough symptoms.  - Consider nasal saline rinses 1-2 times daily to remove allergens from the nasal cavities as well as help with mucous clearance (this is especially helpful to do before the nasal sprays are given) - Consider allergy  shots as a means of long-term control. - Allergy  shots re-train and reset the immune system to ignore environmental allergens and decrease the resulting immune response to those allergens (sneezing, itchy watery eyes, runny nose, nasal congestion, etc).    - Allergy  shots improve symptoms in 75-85% of patients.  - We can discuss more at the next appointment if the medications are not working for you.   3. Return in about 3 months (around 02/04/2025). You can have the follow up appointment with Dr. Iva or a  Nurse Practicioner (our Nurse Practitioners are excellent and always have Physician oversight!).    Subjective:   Andrea Gregory is a 68 y.o. 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  Discussed the use of AI scribe software for clinical note transcription with the patient and/or guardian, who gave verbal consent to proceed.  Andrea Gregory is a 68 y.o. female presenting for skin testing. She was last seen on December 3rd. We could not do testing because her insurance company does not cover testing on the same day as a New Patient visit. She has been off of all antihistamines 3 days in anticipation of the testing.   Otherwise, there have been no changes to her past medical history, surgical history, family history, or social history.    Review of systems otherwise negative other  than that mentioned in the HPI.    Objective:   There were no vitals taken for this visit. There is no height or weight on file to calculate BMI.    Physical exam deferred since this was a skin testing appointment only.   Diagnostic studies:   Allergy  Studies:     Airborne Adult Perc - 11/06/24 0841     Time Antigen Placed 9157    Allergen Manufacturer Jestine     Location Back    Number of Test 55    1. Control-Buffer 50% Glycerol Negative    2. Control-Histamine 3+    3. Bahia Negative    4. [place] 2+    5. Johnson Negative    6. [place]  Blue Negative    7. Meadow Fescue Negative    8. Perennial Rye Negative    9. Timothy Negative    10. Ragweed Mix 2+    11. Cocklebur Negative    12. Plantain,  English Negative    13. Baccharis 2+    14. Dog Fennel Negative    15. Russian Thistle 2+    16. Lamb's Quarters 2+    17. Sheep Sorrell 2+    18. Rough Pigweed Negative    19. Marsh Elder, Rough Negative    20. Mugwort, Common Negative    21. Box, Elder Negative    22. Cedar, red Negative    23. Sweet Gum Negative    24. Pecan Pollen Negative    25. Pine Mix Negative    26. Walnut, Black Pollen --   +/-   27. Red Mulberry Negative    28. Ash Mix Negative    29. Birch Mix Negative    30. Beech American Negative    31. Cottonwood, Eastern Negative    32. Hickory, White Negative    33. Maple Mix Negative    34. Oak, Eastern Mix Negative    35. Sycamore Eastern Negative    36. Alternaria Alternata Negative    37. Cladosporium Herbarum Negative    38. Aspergillus Mix Negative    39. Penicillium Mix Negative    40. Bipolaris Sorokiniana (Helminthosporium) Negative    41. Drechslera Spicifera (Curvularia) Negative    42. Mucor Plumbeus 2+    43. Fusarium Moniliforme Negative    44. Aureobasidium Pullulans (pullulara) Negative    45. Rhizopus Oryzae Negative    46. Botrytis Cinera Negative    47. Epicoccum Nigrum Negative    48. Phoma Betae Negative    49. Dust Mite Mix Negative    50. Cat Hair 10,000 BAU/ml Negative    51.  Dog Epithelia Negative    52. Mixed Feathers Negative    53. Horse Epithelia Negative    54. Cockroach, German Negative    55. Tobacco Leaf Negative          Intradermal - 11/06/24 0924     Time Antigen Placed 0930    Allergen Manufacturer Jestine    Location Arm    Number of Test 12    Control  Negative    Bahia Negative    Johnson Negative    7 Grass Negative    Tree Mix Negative    Mold 1 Negative    Mold 2 Negative    Mold 4 Negative    Mite Mix 3+    Cat Negative    Dog Negative    Cockroach Negative          Food Adult Perc -  11/06/24 0800     Time Antigen Placed 9157    Allergen Manufacturer Jestine    Location Back    Number of allergen test 5    38. Tomato Negative    47. Onion Negative    62. Cherry Negative    65. Pineapple Negative    66. Chocolate/Cacao Bean Negative          Allergy  testing results were read and interpreted by myself, documented by clinical staff.      Marty Shaggy, MD  [facility] 

## 2024-11-08 ENCOUNTER — Encounter (INDEPENDENT_AMBULATORY_CARE_PROVIDER_SITE_OTHER): Payer: Self-pay

## 2025-02-05 ENCOUNTER — Ambulatory Visit: Admitting: Family Medicine
